# Patient Record
Sex: Female | Born: 1960 | Race: White | Hispanic: No | Marital: Married
Health system: Northeastern US, Academic
[De-identification: ages and names within clinical notes are randomized; demographics above are authoritative.]

---

## 2009-12-04 DEATH — deceased

## 2014-10-02 ENCOUNTER — Telehealth: Payer: Self-pay

## 2014-10-02 NOTE — Telephone Encounter (Signed)
Potential donor for brother, Linward Foster, called 10/02/14 at 1:15 pm.  Please return call to [phone].

## 2014-10-05 ENCOUNTER — Telehealth: Payer: Self-pay

## 2014-10-05 NOTE — Telephone Encounter (Signed)
Contacted Jean Douglas who is interested in being a donor for her brother, Jean Douglas.  Jean Douglas has HTN and has been taking Lisinopril for 1.5 years.  Let donor know that, unfortunately, this excludes her from being a donor at our center.  Donor verbalized understanding.

## 2019-06-06 ENCOUNTER — Encounter: Admit: 2019-06-06 | Payer: PRIVATE HEALTH INSURANCE | Attending: Neurology | Primary: Internal Medicine

## 2019-06-06 DIAGNOSIS — G35 Multiple sclerosis: Secondary | ICD-10-CM

## 2019-06-11 MED ORDER — FINGOLIMOD 0.5 MG CAPSULE
0.5 mg | ORAL_CAPSULE | ORAL | 6 refills | 60.00 days | Status: AC
Start: 2019-06-11 — End: 2019-12-10
  Filled 2019-06-23: qty 15, 30d supply, fill #0

## 2019-06-13 ENCOUNTER — Encounter: Admit: 2019-06-13 | Payer: PRIVATE HEALTH INSURANCE | Attending: Neurology | Primary: Internal Medicine

## 2019-06-13 ENCOUNTER — Ambulatory Visit: Admit: 2019-06-13 | Payer: PRIVATE HEALTH INSURANCE | Attending: Neurology | Primary: Internal Medicine

## 2019-06-13 DIAGNOSIS — I10 Essential (primary) hypertension: Secondary | ICD-10-CM

## 2019-06-13 DIAGNOSIS — G35 Multiple sclerosis: Secondary | ICD-10-CM

## 2019-06-13 MED ORDER — ROSUVASTATIN 5 MG TABLET
5 mg | Status: AC
Start: 2019-06-13 — End: ?

## 2019-06-13 NOTE — Progress Notes
The following is the transcribed Review of Systems completed by the patient at intake.Review of Systems Musculoskeletal: Positive for back pain and neck pain. Neurological: Negative.       NO NEW SYMPTOMS

## 2019-06-13 NOTE — Progress Notes
Last seen 10/9/20MS diagnosed 2004. Last exacerbation 2010. On Avonex 11 years. Gilenya started 12/15. No issues with Gilenya. Gets depressed if she goes off the Sertraline 50 mg. Rare painful dysesthesia right foot and and hand. Except for heat sensitivity she is not very limited by her disease. MRI 10/24/16?of brain showed stable mild T2 disease. C-spine showed normal cord 8/17. MRI 12/28/18 Brain shows no change in MS lesionsMRI 12/28/18 C-spine shows stable since cord lesion These studies are reviewed with the patient.She is JCV Ab +. No new issues since last seen 6 months ago. 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 Has low lymphocytes inspite of taking Gilenya only TIW.She has no new neurological issues. Still some stabbing toe pain at time. ?She remains very active. ?She plays single tennis! ?She is still working and doing very well with real estate.Current Outpatient Medications on File Prior to Visit Medication Sig Dispense Refill ? ergocalciferol (ERGOCALCIFEROL) 1,250 mcg (50,000 unit) capsule Take 5,000 Units by mouth daily.   ? fingolimod (GILENYA) 0.5 mg capsule Take 1 capsule (0.5 mg total) by mouth every other day. 30 capsule 5 ? hydroCHLOROthiazide (HYDRODIURIL) 12.5 mg tablet    ? lisinopril (PRINIVIL,ZESTRIL) 2.5 MG tablet Take 2.5 mg by mouth daily.   ? multivit with minerals/lutein (MULTIVITAMIN 50 PLUS ORAL) multivitamin   ? ALPRAZolam (XANAX) 0.25 mg tablet    ? estradioL (ESTRACE) 0.01 % vaginal cream 1 g.   ? Non-Formulary Medication Avonex  q week   ? rosuvastatin (CRESTOR) 5 mg tablet rosuvastatin 5 mg tablet TAKE ONE TABLET BY MOUTH EVERY DAY   No current facility-administered medications on file prior to visit.  General ExaminationHEENT:  normalNeck:  Supple, non-tender, no carotid bruitsHeart:  Normal rate and rhythm, no murmurs            Good pedal pulses, no edemaChest:Back:  Extremities:  Full ROM, no deformitiesNeurological ExaminationMental Status:   Alert, cooperative, oriented to name, place, time, situation                          Intact recent and remote memory                          Normal mood and affect                          Normal language and thoughtCraneal Nerves:	II  Visual fields intact to confrontation, normal fundoscopy, V/A          Color perception	III, IV, VI   EOMI, No nystagmus, PERRL, 	V  symmetrical facial sensation	VII  symmetrical facial expression	VIII  intact hearing and balance	IX, X  good gag, normal swallow, no dysarthria	XI  Intact shoulder shrug and head rotation	XII   Tongue to midlineMotor:  normal bulk, tone, strength, no pronator driftSensory:  Intact to LT,  Vibration, Romberg negativeCoordination:  Normal RAM, FFM, FtoN, gait, tandemDTRs:  2+ and symmetrical at biceps, triceps, brachioradialis,  Patella and Achilles25 foot walk 3.45 sec  4/9/21Impression:This is 59 year female with 11 year h/o MS.  She is doing extremely well.  She needs her labs check.  Continue Gilenya 3 times per week and Vitamin D

## 2019-06-19 ENCOUNTER — Encounter: Admit: 2019-06-19 | Payer: PRIVATE HEALTH INSURANCE | Attending: Neurology | Primary: Internal Medicine

## 2019-06-19 LAB — CBC AND DIFFERENTIAL
BASOPHILS ABSOLUTE COUNT: 19 {cells}/uL (ref 0–200)
BASOPHILS: 0.6 %
EOSINOPHILS ABSOLUTE COUNT: 51 {cells}/uL (ref 15–500)
EOSINOPHILS: 1.6 %
HEMATOCRIT BLOOD: 41.2 % (ref 35.0–45.0)
HEMOGLOBIN: 13.6 g/dL (ref 11.7–15.5)
LYMPHOCYTES ABSOLUTE COUNT: 355 {cells}/uL — ABNORMAL LOW (ref 850–3900)
LYMPHOCYTES: 11.1 %
MCH: 26.7 pg — ABNORMAL LOW (ref 27.0–33.0)
MCHC-HEMOGLOBINOPATHY: 33 g/dL (ref 32.0–36.0)
MCV: 80.8 fL (ref 80.0–100.0)
MONOCYTES ABSOLUTE COUNT: 426 {cells}/uL (ref 200–950)
MONOCYTES: 13.3 %
MPV: 12.1 fL (ref 7.5–12.5)
NEUTROPHILS ABSOLUTE COUNT: 2349 {cells}/uL (ref 1500–7800)
NEUTROPHILS: 73.4 %
PLATELET COUNT: 206 10*3/uL (ref 140–400)
RDW: 13.6 % (ref 11.0–15.0)
RED BLOOD CELL COUNT: 5.1 10*6/uL (ref 3.80–5.10)
WHITE BLOOD CELL COUNT: 3.2 10*3/uL — ABNORMAL LOW (ref 3.8–10.8)

## 2019-06-19 LAB — COMPREHENSIVE METABOLIC PANEL
ALBUMIN/GLOBULIN RATIO: 1.8 (calc) (ref 1.0–2.5)
ALBUMIN: 4.7 g/dL (ref 3.6–5.1)
ALKALINE PHOSPHATASE: 40 U/L (ref 37–153)
ALT (SGPT): 39 U/L — ABNORMAL HIGH (ref 6–29)
AST (SGOT): 28 U/L (ref 10–35)
BILIRUBIN, TOTAL: 0.6 mg/dL (ref 0.2–1.2)
BLOOD UREA NITROGEN: 16 mg/dL (ref 7–25)
CALCIUM: 10 mg/dL (ref 8.6–10.4)
CHLORIDE: 100 mmol/L (ref 98–110)
CO2: 28 mmol/L (ref 20–32)
CREATININE: 0.83 mg/dL (ref 0.50–0.99)
EGFR (AFR AMER): 89 mL/min/{1.73_m2} (ref >=60)
EGFR (NON AFRICAN AMERICAN): 77 mL/min/{1.73_m2} (ref >=60)
GLOBULIN: 2.6 g/dL (ref 1.9–3.7)
GLUCOSE: 86 mg/dL (ref 65–99)
POTASSIUM: 4 mmol/L (ref 3.5–5.3)
PROTEIN, TOTAL, SPEP: 7.3 g/dL (ref 6.1–8.1)
SODIUM: 137 mmol/L (ref 135–146)

## 2019-07-25 MED FILL — FINGOLIMOD 0.5 MG CAPSULE: 0.5 mg | ORAL | 30 days supply | Qty: 15 | Fill #1 | Status: CP

## 2019-08-25 MED FILL — FINGOLIMOD 0.5 MG CAPSULE: 0.5 mg | ORAL | 30 days supply | Qty: 15 | Fill #2 | Status: CP

## 2019-10-07 ENCOUNTER — Encounter: Admit: 2019-10-07 | Payer: PRIVATE HEALTH INSURANCE | Primary: Internal Medicine

## 2019-10-12 ENCOUNTER — Telehealth: Admit: 2019-10-12 | Payer: PRIVATE HEALTH INSURANCE | Attending: Neurology | Primary: Internal Medicine

## 2019-10-12 NOTE — Telephone Encounter
Called 1x, spoke w/ patient who will call back on Monday to r/s

## 2019-10-13 MED FILL — FINGOLIMOD 0.5 MG CAPSULE: 0.5 mg | ORAL | 30 days supply | Qty: 15 | Fill #3 | Status: CP

## 2019-10-29 ENCOUNTER — Telehealth: Admit: 2019-10-29 | Payer: PRIVATE HEALTH INSURANCE | Attending: Neurology | Primary: Internal Medicine

## 2019-10-29 ENCOUNTER — Encounter: Admit: 2019-10-29 | Payer: PRIVATE HEALTH INSURANCE | Attending: Neurology | Primary: Internal Medicine

## 2019-10-29 NOTE — Telephone Encounter
YM CARE CENTER MESSAGETime of call:   2:49 PMCaller:   Larin Doctor, hospital relationship to patient:  IT sales professional from (pharmacy, hospital, agency, etc.):  Home    Reason for call:   Jennifer Clark would like to know if it is okay for her to get her Covid booster shot. She needs to know soon so that she can go to [facility] today.If not feeling well, what are symptoms:  n/a   If having symptoms, how long have the symptoms been present:  n/a   Does caller request to speak to someone urgently?  yes   Best telephone number for callback:   [phone] time to return call:   Anytime Permission to leave message:  yes   Larose Kells [facility] Scheduler

## 2019-11-28 ENCOUNTER — Encounter: Admit: 2019-11-28 | Payer: PRIVATE HEALTH INSURANCE | Attending: Neurology | Primary: Internal Medicine

## 2019-12-03 ENCOUNTER — Encounter: Admit: 2019-12-03 | Payer: PRIVATE HEALTH INSURANCE | Attending: Neurology | Primary: Internal Medicine

## 2019-12-05 ENCOUNTER — Encounter: Admit: 2019-12-05 | Payer: PRIVATE HEALTH INSURANCE | Attending: Neurology | Primary: Internal Medicine

## 2019-12-05 DIAGNOSIS — G35 Multiple sclerosis: Secondary | ICD-10-CM

## 2019-12-10 MED ORDER — FINGOLIMOD 0.5 MG CAPSULE
0.5 mg | ORAL_CAPSULE | ORAL | 4 refills | 30.00 days | Status: AC
Start: 2019-12-10 — End: 2019-12-19

## 2019-12-10 NOTE — Telephone Encounter
Medication Refill Safety Checks:Medication order: gilenya Is this a new order? NoIf yes, was the previous order discontinued? Not ApplicableIf the order is being updated, did you update the instruction box? Not ApplicableWere the medication safety checks including the 5 rights of medication administration (Patient, medication name, dose, route, time/frequency) completed prior to sending the request to pharmacy? YesFor tapering/titrating dosages, were the medication dosages and schedules verified with a second nurse? Not ApplicableCorrect pharmacy? yes

## 2019-12-11 ENCOUNTER — Encounter: Admit: 2019-12-11 | Payer: PRIVATE HEALTH INSURANCE | Attending: Neurology | Primary: Internal Medicine

## 2019-12-11 DIAGNOSIS — G35 Multiple sclerosis: Secondary | ICD-10-CM

## 2019-12-17 ENCOUNTER — Encounter: Admit: 2019-12-17 | Payer: PRIVATE HEALTH INSURANCE | Attending: Neurology | Primary: Internal Medicine

## 2019-12-19 ENCOUNTER — Encounter: Admit: 2019-12-19 | Payer: PRIVATE HEALTH INSURANCE | Attending: Neurology | Primary: Internal Medicine

## 2019-12-19 DIAGNOSIS — G35 Multiple sclerosis: Secondary | ICD-10-CM

## 2019-12-19 MED ORDER — FINGOLIMOD 0.5 MG CAPSULE
0.5 mg | ORAL_CAPSULE | ORAL | 4 refills | 30.00 days | Status: AC
Start: 2019-12-19 — End: 2020-04-04
  Filled 2019-12-25: qty 10, 30d supply, fill #0

## 2020-01-21 MED FILL — FINGOLIMOD 0.5 MG CAPSULE: 0.5 mg | ORAL | 30 days supply | Qty: 10 | Fill #1 | Status: CP

## 2020-02-04 ENCOUNTER — Encounter: Admit: 2020-02-04 | Payer: PRIVATE HEALTH INSURANCE | Attending: Neurology | Primary: Internal Medicine

## 2020-02-06 ENCOUNTER — Telehealth: Admit: 2020-02-06 | Payer: PRIVATE HEALTH INSURANCE | Attending: Neurology | Primary: Internal Medicine

## 2020-02-06 NOTE — Telephone Encounter
Called patient to schedule for a tele health appointment for pt Advice request inbasket and patient declined requesting a call back from Dr. Michaell Cowing today.Patient stated she is currently sick with covid and has some questions.Patient stated she had a infusion yesterday at [facility] and she is not feeling well and she would like to talk to Dr. Michaell Cowing regarding one of her medications being the reason she got covid.Patient # [phone]

## 2020-02-26 MED FILL — FINGOLIMOD 0.5 MG CAPSULE: 0.5 mg | ORAL | 30 days supply | Qty: 10 | Fill #2 | Status: CP

## 2020-03-29 ENCOUNTER — Encounter: Admit: 2020-03-29 | Payer: PRIVATE HEALTH INSURANCE | Attending: Neurology | Primary: Internal Medicine

## 2020-04-04 ENCOUNTER — Encounter: Admit: 2020-04-04 | Payer: PRIVATE HEALTH INSURANCE | Attending: Neurology | Primary: Internal Medicine

## 2020-04-04 DIAGNOSIS — G35 Multiple sclerosis: Secondary | ICD-10-CM

## 2020-04-04 MED ORDER — FINGOLIMOD 0.5 MG CAPSULE
0.5 mg | ORAL_CAPSULE | ORAL | 4 refills | 30.00 days | Status: AC
Start: 2020-04-04 — End: 2020-04-05

## 2020-04-05 ENCOUNTER — Encounter: Admit: 2020-04-05 | Payer: PRIVATE HEALTH INSURANCE | Attending: Neurology | Primary: Internal Medicine

## 2020-04-05 DIAGNOSIS — G35 Multiple sclerosis: Secondary | ICD-10-CM

## 2020-04-05 MED ORDER — FINGOLIMOD 0.5 MG CAPSULE
0.5 mg | ORAL_CAPSULE | ORAL | 4 refills | 84.00 days | Status: AC
Start: 2020-04-05 — End: 2020-12-04
  Filled 2020-04-12: qty 12, 28d supply, fill #0

## 2020-05-22 MED FILL — FINGOLIMOD 0.5 MG CAPSULE: 0.5 mg | ORAL | 28 days supply | Qty: 12 | Fill #1 | Status: CP

## 2020-06-24 MED FILL — FINGOLIMOD 0.5 MG CAPSULE: 0.5 mg | ORAL | 28 days supply | Qty: 12 | Fill #2 | Status: CP

## 2020-08-11 MED FILL — FINGOLIMOD 0.5 MG CAPSULE: 0.5 mg | ORAL | 28 days supply | Qty: 12 | Fill #3 | Status: CP

## 2020-08-31 ENCOUNTER — Encounter: Admit: 2020-08-31 | Payer: PRIVATE HEALTH INSURANCE | Attending: Neurology | Primary: Internal Medicine

## 2020-08-31 DIAGNOSIS — G35 Multiple sclerosis: Secondary | ICD-10-CM

## 2020-08-31 DIAGNOSIS — I10 Essential (primary) hypertension: Secondary | ICD-10-CM

## 2020-08-31 MED ORDER — ALPRAZOLAM 0.25 MG TABLET
0.25 mg | ORAL_TABLET | Freq: Two times a day (BID) | ORAL | 1 refills | 5.00 days | Status: AC | PRN
Start: 2020-08-31 — End: ?
  Filled 2020-09-01: qty 10, 5d supply, fill #0
  Filled 2020-09-01: qty 10, 5d supply, fill #1

## 2020-08-31 NOTE — Progress Notes
Last seen 4/9/22MS diagnosed 2004. Had left sided numbness. Some weakness.  MRI positive.  CSF positive.  Diagnosed by Dr. Nori Riis in [place]. Followed by Donell Sievert at [facility] for a while. Last exacerbation 2010. On Avonex 11 years. Gilenya started 12/15. No issues with Gilenya. Gets depressed if she goes off the Sertraline 50 mg. Rare painful dysesthesia right foot and and hand. Except for heat sensitivity she is not very limited by her disease. MRI 10/24/16?of brain showed stable mild T2 disease. C-spine showed normal cord 8/17. MRI 12/28/18 Brain shows no change in MS lesionsMRI 12/28/18 C-spine shows stable since cord lesion These studies are reviewed with the patient.She is JCV Ab +. No new issues since last seen 6 months ago. Is taking Gilenya?3 times a week due to persistent lymphpenia.Still plays single tennis frequently. Has low lymphocytes inspite of taking Gilenya only TIW. She has?no?new neurological issues. Still some stabbing toe pain at time. ?She remains very active. ?She plays single tennis!??She is still working and doing very well with real estate.6/28/22MS 18 years.  She remains active and plays tennis. Walks a few miles a day. Still works in Research officer, political party.  Continues to feel well.  06/18/20 lymphocytes 335Still only takes Gilenya .5 mg TIWGets electrical sensation in right toes every few weeks which lasts 3-4 minutes.  It is quite severe.  Gets fatigued in the afternoon.  She has to rest.She had Covid-19 in the spring.  Current Outpatient Medications on File Prior to Visit Medication Sig Dispense Refill ? ergocalciferol (ERGOCALCIFEROL) 1,250 mcg (50,000 unit) capsule Take 5,000 Units by mouth daily.   ? estradioL (ESTRACE) 0.01 % vaginal cream 1 g.   ? fingolimod (GILENYA) 0.5 mg capsule Take 1 capsule (0.5 mg total) by mouth Every Monday, Wednesday, and Friday. 36 capsule 3 ? hydroCHLOROthiazide (HYDRODIURIL) 12.5 mg tablet    ? lisinopril (PRINIVIL,ZESTRIL) 2.5 MG tablet Take 2.5 mg by mouth daily.   ? multivit with minerals/lutein (MULTIVITAMIN 50 PLUS ORAL) multivitamin   ? rosuvastatin (CRESTOR) 5 mg tablet rosuvastatin 5 mg tablet TAKE ONE TABLET BY MOUTH EVERY DAY   ? ALPRAZolam (XANAX) 0.25 mg tablet    ? Non-Formulary Medication Avonex  q week   No current facility-administered medications on file prior to visit. Past Medical History: Diagnosis Date ? Hypertension  ? Multiple sclerosis (HC Code)  General ExaminationHEENT:  normalNeck:  Supple, non-tender, no carotid bruitsHeart:  Normal rate and rhythm, no murmurs            Good pedal pulses, no edemaChest:Back:  Extremities:  Full ROM, no deformitiesNeurological ExaminationMental Status:   Alert, cooperative, oriented to name, place, time, situation                          Intact recent and remote memory                          Normal mood and affect                          Normal language and thoughtCraneal Nerves:	II  Visual fields intact to confrontation, normal fundoscopy, V/A          Color perception	III, IV, VI   EOMI, No nystagmus, PERRL, 	V  symmetrical facial sensation	VII  symmetrical facial expression	VIII  intact hearing and balance	IX, X  good gag, normal swallow, no dysarthria	XI  Intact shoulder shrug and head  rotation	XII   Tongue to midlineMotor:  normal bulk, tone, strength, no pronator driftSensory:  Intact to LT,  Vibration, Romberg negativeCoordination:  Normal RAM, FFM, FtoN, gait, tandemDTRs:  2+ and symmetrical at biceps, triceps, brachioradialis,  Patella and AchillesPlantar response:  FlexorImpression:  Jennifer Clark is 60 year old female with 42 year h/o MS.  She has been very stable on Gilenya for 7 years.  She has persistent lymphopenia and only takes Gilenya 3 times a week.  She can continue on Gilenya.Plan:1.  Continue Gilenya .5 mg  3 times per week2.  Labs with lymphocyte subsets3.  Covid-19 spike antibodies4.  MRIs

## 2020-09-01 MED FILL — FINGOLIMOD 0.5 MG CAPSULE: 0.5 mg | ORAL | 28 days supply | Qty: 12 | Fill #4 | Status: CP

## 2020-09-03 LAB — SARS-COV-2 (COVID-19) SPIKE ANTIBODY (IGG), SEMI-QUANTITATIVE (GH L Q)
IMMUNOGLOBULIN G SUBCLASS 2: 102.15 index — ABNORMAL HIGH (ref <1.00)
SARS-COV-2 SPIKE (IGG), SEMI-QUANTITATIVE: 102.15 {index} — ABNORMAL HIGH (ref <1.00)

## 2020-09-03 LAB — LYMPHOCYTE SUBSET PANEL 2     (LMW Q)
% CD19 (B CELLS): 9 % (ref 6–29)
% CD3 (MATURE T CELLS): 25 % — ABNORMAL LOW (ref 57–85)
ABSOLUTE CD19+ CELLS: 21 {cells}/uL — ABNORMAL LOW (ref 110–660)
CD3 ABSOLUTE: 62 {cells}/uL — ABNORMAL LOW (ref 840–3060)
CD4 % HELPER T CELL: 9 % — ABNORMAL LOW (ref 30–61)
CD4 T CELL ABSOLUTE: 21 {cells}/uL — ABNORMAL LOW (ref 490–1740)
CD8 % SUPPRESSOR T CELL: 17 % (ref 12–42)
CD8 T CELL ABSOLUTE: 42 cells/uL — ABNORMAL LOW (ref 180–1170)
HELPER/SUPPRESSOR RATIO: 0.51 — ABNORMAL LOW (ref 0.86–5.00)
LYMPHOCYTES ABSOLUTE COUNT: 245 cells/uL — ABNORMAL LOW (ref 850–3900)

## 2020-09-03 LAB — IMMUNOGLOBULIN G SUBCLASSES     (BH GH LMW Q YH)
IMMUNOGLOBULIN G SUBCLASS 1: 653 mg/dL (ref 382–929)
IMMUNOGLOBULIN G SUBCLASS 3: 15 mg/dL — ABNORMAL LOW (ref 22–178)
IMMUNOGLOBULIN G SUBCLASS 4: 23.8 mg/dL (ref 4–86)
IMMUNOGLOBULIN G, SERUM: 976 mg/dL (ref 600–1540)

## 2020-09-04 ENCOUNTER — Encounter: Admit: 2020-09-04 | Payer: PRIVATE HEALTH INSURANCE | Attending: Neurology | Primary: Internal Medicine

## 2020-09-20 ENCOUNTER — Encounter: Admit: 2020-09-20 | Payer: PRIVATE HEALTH INSURANCE | Attending: Neurology | Primary: Internal Medicine

## 2020-09-20 ENCOUNTER — Telehealth: Admit: 2020-09-20 | Payer: PRIVATE HEALTH INSURANCE | Attending: Neurology | Primary: Internal Medicine

## 2020-09-20 NOTE — Telephone Encounter
Faxed and Confirmation received

## 2020-09-20 NOTE — Telephone Encounter
YM CARE CENTER MESSAGETime of call:   1:35 PMCaller:   PhilCaller's relationship to patient:  n/a  Calling from (pharmacy, hospital, agency, etc.):  [facility]   Reason for call:   Phil from [facility] called, he says patient called to set up appointment for MRI of brain but he does not have order on file for that. Please fax order to [phone] not feeling well, what are symptoms:  n/a   If having symptoms, how long have the symptoms been present:  n/a   Does caller request to speak to someone urgently?  no   If yes, warm transferred to:  Best telephone number for callback:  [phone] time to return call:   Anytime before 6:00 pm Permission to leave message:  yes   [facility] Referral Specialist

## 2020-10-05 ENCOUNTER — Encounter: Admit: 2020-10-05 | Payer: PRIVATE HEALTH INSURANCE | Attending: Neurology | Primary: Internal Medicine

## 2020-10-05 DIAGNOSIS — G35 Multiple sclerosis: Secondary | ICD-10-CM

## 2020-10-05 MED FILL — FINGOLIMOD 0.5 MG CAPSULE: 0.5 mg | ORAL | 28 days supply | Qty: 12 | Fill #5 | Status: CP

## 2020-10-21 ENCOUNTER — Encounter: Admit: 2020-10-21 | Payer: PRIVATE HEALTH INSURANCE | Attending: Neurology | Primary: Internal Medicine

## 2020-11-10 MED FILL — FINGOLIMOD 0.5 MG CAPSULE: 0.5 mg | ORAL | 28 days supply | Qty: 12 | Fill #6 | Status: CP

## 2020-12-04 ENCOUNTER — Encounter: Admit: 2020-12-04 | Payer: PRIVATE HEALTH INSURANCE | Primary: Internal Medicine

## 2020-12-04 MED ORDER — FINGOLIMOD 0.5 MG CAPSULE
0.5 mg | ORAL_CAPSULE | ORAL | 1 refills | 84.00 days | Status: AC
Start: 2020-12-04 — End: 2021-04-02
  Filled 2020-12-13: qty 12, 28d supply, fill #0

## 2020-12-04 MED ORDER — FINGOLIMOD 0.5 MG CAPSULE
0.5 mg | ORAL_CAPSULE | ORAL | 1 refills | 84.00 days | Status: AC
Start: 2020-12-04 — End: 2020-12-04

## 2021-01-15 MED FILL — FINGOLIMOD 0.5 MG CAPSULE: 0.5 mg | ORAL | 28 days supply | Qty: 12 | Fill #1 | Status: CP

## 2021-03-11 MED FILL — FINGOLIMOD 0.5 MG CAPSULE: 0.5 mg | ORAL | 28 days supply | Qty: 12 | Fill #2 | Status: CP

## 2021-04-01 ENCOUNTER — Encounter: Admit: 2021-04-01 | Payer: PRIVATE HEALTH INSURANCE | Attending: Neurology | Primary: Internal Medicine

## 2021-04-02 MED ORDER — FINGOLIMOD 0.5 MG CAPSULE
0.5 mg | ORAL_CAPSULE | ORAL | 1 refills | 28.00 days | Status: AC
Start: 2021-04-02 — End: 2021-04-21
  Filled 2021-04-13: qty 36, 28d supply, fill #1
  Filled 2021-04-13: qty 12, 28d supply, fill #0

## 2021-04-21 ENCOUNTER — Ambulatory Visit
Admit: 2021-04-21 | Payer: PRIVATE HEALTH INSURANCE | Attending: Student in an Organized Health Care Education/Training Program | Primary: Internal Medicine

## 2021-04-21 ENCOUNTER — Encounter
Admit: 2021-04-21 | Payer: PRIVATE HEALTH INSURANCE | Attending: Student in an Organized Health Care Education/Training Program | Primary: Internal Medicine

## 2021-04-21 DIAGNOSIS — I10 Essential (primary) hypertension: Secondary | ICD-10-CM

## 2021-04-21 DIAGNOSIS — G35 Multiple sclerosis: Secondary | ICD-10-CM

## 2021-04-21 MED ORDER — FINGOLIMOD 0.5 MG CAPSULE
0.5 mg | ORAL_CAPSULE | Freq: Every day | ORAL | 3 refills | 30.00 days | Status: AC
Start: 2021-04-21 — End: 2021-08-02
  Filled 2021-04-26: qty 30, 30d supply, fill #0

## 2021-04-21 NOTE — Progress Notes
NEUROIMMUNOLOGY NEW PATIENT VISITHPI: I had the pleasure of evaluating Jennifer Clark for an initial outpatient neurologic consultation at the [facility] today, 04/21/2021. Multiple Sclerosis History: MS was diagnosed 2004, at which time she developed left sided paresthesias suddenly while in the canoe. This was face/arm/leg on that side. Slight L arm weakness with this. Went to an MD who was worried about a stroke and sent her to [facility].  MRI positive and CSF were reportedly consistent with MS.  Diagnosed by Dr. Nori Riis in [place]. Followed by Donell Sievert at [facility] for a while. Last exacerbation 2010. She was on Avonex for 11 years, had flu-like side effects. Gilenya was then started 02/2014. No issues with Gilenya.  Rare painful dysesthesia right foot and and hand. Except for heat sensitivity she is not very limited by her disease. In 2020 she started taking Gilenya?3 times a week due to persistent lymphopenia.?She plays single tennis, still working and doing very well with real estate.She had COVID 01/2020 and again 03/2021. First time she had to get monoclonal Ab, but was out for about 8 weeks. She had been vaccinated. She continues on TIW dosing. No progression. Biggest symptom is fatigue, mainly in the late afternoon. She also notes pain in her R front toe- intermittent and unclear trigger. Lasts a few minutes. Yoga/medication. She walks 4 miles and swims regularly. Went to [employer] and came in second place!?Review of Systems: A complete 14-point review of systems was completed by the patient and entered separately. Pertinent positives were reviewed by me. Allergies:Jennifer Clark is allergic to prednisone.PMH: Jennifer Clark  has a past medical history of Hypertension and Multiple sclerosis (HC Code) (HC CODE) (HC Code).Jennifer Clark  has a past surgical history that includes Appendectomy and Ectopic pregnancy surgery.Social history:Children: YesOccupation: real estateShe  reports that she has never smoked. She does not have any smokeless tobacco history on file. She reports that she does not drink alcohol and does not use drugs.Family History:  No h/o autoimmune diseases including MS. Jennifer Clark Family history is unknown by patient.  Current medications: Current Outpatient Medications: ?  ALPRAZolam, 0.25 mg, Oral, BID PRN?  ergocalciferol, 5,000 Units, Oral, Daily?  hydroCHLOROthiazide, ?  lisinopriL, 2.5 mg, Oral, Daily?  multivit with minerals/lutein (MULTIVITAMIN 50 PLUS ORAL), multivitamin?  rosuvastatin, rosuvastatin 5 mg tablet  TAKE ONE TABLET BY MOUTH EVERY DAY?  fingolimod, 0.5 mg, Oral, DailyPhysical examinationVitals:  04/21/21 1353 BP: 136/87 Pulse: 71  Weight: 79.4 kg General: Jennifer Clark is a  well-developed White female. She is pleasant and cooperative with the exam. No rashes or pedal edema.Neurologic Exam Mental Status Oriented to person, place, and time. Follows 3 step commands. Attention: normal. Concentration: normal. Speech: speech is normal Level of consciousness: alertKnowledge: good. Able to name object. Able to read. Normal comprehension. Cranial Nerves CN II Visual fields full to confrontation. Right visual field deficit: noneLeft visual field deficit: none CN III, IV, VI Pupils are equal, round, and reactive to light.Extraocular motions are normal. Right pupil: Reactivity: brisk. Consensual response: intact. Left pupil: Reactivity: brisk. Consensual response: intact. CN III: no CN III palsyCN VI: no CN VI palsyNystagmus: none Diplopia: noneOphthalmoparesis: noneUpgaze: normalDowngaze: normalConjugate gaze: presentCN V Facial sensation intact. CN VII Facial expression full, symmetric. CN VIII CN VIII normal. CN IX, X CN IX normal. CN X normal. CN XI CN XI normal. Right sternocleidomastoid strength: normalLeft sternocleidomastoid strength: normalRight trapezius strength: normalLeft trapezius strength: normalCN XII CN XII normal. Motor Exam Muscle bulk:  normalOverall muscle tone: normalRight arm tone: normalLeft arm tone: normalRight arm pronator drift: absentLeft arm pronator drift: absentRight leg tone: normalLeft leg tone: normalStrength Strength 5/5 except as noted. Sensory Exam Light touch normal. Right leg vibration: decreased from toesLeft leg vibration: decreased from toesPinprick normal. Gait, Coordination, and Reflexes GaitGait: normalCoordination Romberg: positiveFinger to nose coordination: normalHeel to shin coordination: normalTandem walking coordination: normalTremor Resting tremor: absentIntention tremor: absentAction tremor: absentReflexes Reflexes 2+ except as noted. Imaging:MRI brain 10/2020:  The actual images were personally reviewed and interpreted by me. Brain Parenchyma:  There are scattered foci of white matter signal hyperintensity on FLAIR sequences involving both cerebral hemispheres which could be attributed to given history of multiple sclerosis and similar to the prior. There are no new lesions identified.  There is no evidence of acute infarction.  There is no evidence of parenchymal hemorrhage.MRI C spine 10/2020: The actual images were personally reviewed and interpreted by me.   On the prior, there was a small elongated focus of T2 signal abnormality involving the dorsal lateral aspect of the cord on the LEFT extending from mid C7 to mid T1. This is again seen on the axial T2-weighted images without change. No new cord lesions identified.Data: Component    Latest Ref Rng 08/31/2020 WBC    3.8 - 10.8 Thousand/uL 3.5 (L)  RBC    3.80 - 5.10 Million/uL 5.12 (H)  Hemoglobin    11.7 - 15.5 g/dL 09.8  Hematocrit    11.9 - 45.0 % 41.6  MCV    80.0 - 100.0 fL 81.3  MCH    27.0 - 33.0 pg 27.1  MCHC    32.0 - 36.0 g/dL 14.7  RDW-CV    82.9 - 15.0 % 13.7  Platelets    140 - 400 Thousand/uL 221  MPV    7.5 - 12.5 fL 11.6  Neutrophils Absolute    1,500 - 7,800 cells/uL 2,860  Lymphocytes Absolute    850 - 3,900 cells/uL 273 (L)  Monocytes (Absolute)    200 - 950 cells/uL 315  Eosinophils Absolute    15 - 500 cells/uL 32  Basophils Absolute    0 - 200 cells/uL 21  Neutrophils    % 81.7  Lymphocytes    % 7.8  Monocytes    % 9.0  Eosinophils    % 0.9  Basophils    % 0.6  Glucose    65 - 99 mg/dL 84  BUN    7 - 25 mg/dL 15  Creatinine    5.62 - 0.99 mg/dL 1.30  eGFR (NON African-American)    > OR = 60 mL/min/1.39m2 90  eGFR (Afr Amer)    > OR = 60 mL/min/1.5m2 105  BUN/Creatinine Ratio    6 - 22 (calc) NOT APPLICABLE  Sodium    135 - 146 mmol/L 139  Potassium    3.5 - 5.3 mmol/L 4.0  Chloride    98 - 110 mmol/L 102  CO2    20 - 32 mmol/L 27  Calcium    8.6 - 10.4 mg/dL 9.8  Protein, Total    6.1 - 8.1 g/dL 7.1  Albumin    3.6 - 5.1 g/dL 4.6  Globulin    1.9 - 3.7 g/dL (calc) 2.5  Albumin/Globulin Ratio    1.0 - 2.5 (calc) 1.8  Bilirubin, Total    0.2 - 1.2 mg/dL 0.7  Alkaline Phosphatase    37 - 153 U/L 48  AST    10 - 35  U/L 27  ALT (SGPT)    6 - 29 U/L 41 (H)  % CD3 (Mature T Cells)    57 - 85 % 25 (L)  CD3 Abs    840 - 3,060 cells/uL 62 (L)  CD4 % Helper T Cell    30 - 61 % 9 (L)  CD4 T Cell Abs    490 - 1,740 cells/uL 21 (L)  CD8 % Suppressor T Cell    12 - 42 % 17  CD8 T Cell Abs    180 - 1,170 cells/uL 42 (L)  Helper/Suppressor Ratio    0.86 - 5.00  0.51 (L)  % CD19 (B CELLS)    6 - 29 % 9  ABSOLUTE CD19+ CELLS    110 - 660 cells/uL 21 (L)  Lymphocytes Absolute Count    850 - 3,900 cells/uL 245 (L)  IgG Subclass 1     382 - 929 mg/dL 403  IgG Subclass 2    241 - 700 mg/dL 474 (L)  IgG Subclass 3    22 - 178 mg/dL 15 (L)  IgG Subclass 4    4 - 86 mg/dL 25.9  Immunoglobulin G, Serum    600 - 1,540 mg/dL 563  SARS CoV2, IgG, Spike, Semi-Quant    <1.00 index 102.15 (H)   (L) Low(H) HighAssessment:Jennifer Clark is a 61 y.o. 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 She appears clinically and radiologically stable on her current regimen with likely adequate lymphocyte sequestration based on peripheral counts. We did discuss that data regarding efficacy at alternate dosing is limited, but in retrospective studies patients who broke through were more inflammatory even on normal dosing. She has not had any increased rate of infections, but some increased severity with infection (COVID In particular). Will plan to continue on current course for now, but consider alternate DMT pending labs and clinical course. Otherwise, I encouraged continuation of regular exercise and targeting ideal BMI. Remainder as below.Plan:-Labs now at [facility] brain next 10/2021; alternate brain and brain/spinal cord QOY-Refill Gilenya today, continue at TIW for now-Weight loss apps, continue with regular exercise-Continue to discuss DMT switch pending repeat labs and clinical course-Follow up in 6 monthsSamantha Jah Alarid, MDAssistant Professor of NeurologyYale UAL Corporation of Medicine 60 minutes was spent with this patient, >50% was spent discussing the considerations for diagnosis and recommended testing, as well as answering the patient's questions.  I have asked that the patient call our office with any questions, concerns, or if they develop new symptoms.

## 2021-05-01 ENCOUNTER — Encounter
Admit: 2021-05-01 | Payer: PRIVATE HEALTH INSURANCE | Attending: Student in an Organized Health Care Education/Training Program | Primary: Internal Medicine

## 2021-05-01 LAB — LYMPHOCYTE SUBSET PANEL 2     (LMW Q)
% CD19 (B CELLS): 8 % (ref 6–29)
% CD3 (MATURE T CELLS): 21 % — ABNORMAL LOW (ref 57–85)
ABSOLUTE CD19+ CELLS: 31 {cells}/uL — ABNORMAL LOW (ref 110–660)
BKR WAM EOSINOPHIL ABSOLUTE COUNT.: 31 cells/uL — ABNORMAL LOW (ref 110–660)
CD3 ABSOLUTE: 77 {cells}/uL — ABNORMAL LOW (ref 840–3060)
CD4 % HELPER T CELL: 6 % — ABNORMAL LOW (ref 30–61)
CD4 T CELL ABSOLUTE: 23 cells/uL — ABNORMAL LOW (ref 490–1740)
CD8 % SUPPRESSOR T CELL: 14 % (ref 12–42)
CD8 T CELL ABSOLUTE: 53 cells/uL — ABNORMAL LOW (ref 180–1170)
HELPER/SUPPRESSOR RATIO: 0.44 — ABNORMAL LOW (ref 0.86–5.00)
LYMPHOCYTES ABSOLUTE COUNT: 371 cells/uL — ABNORMAL LOW (ref 850–3900)

## 2021-05-01 LAB — IMMUNOGLOBULINS IGG, IGA, IGM
BKR WAM MONOCYTES: 1009 mg/dL — ABNORMAL LOW (ref 600–1540)
IMMUNOGLOBULIN G: 1009 mg/dL (ref 600–1540)
IMMUNOGLOBULIN M: 55 mg/dL (ref 50–300)

## 2021-05-23 MED FILL — FINGOLIMOD 0.5 MG CAPSULE: 0.5 mg | ORAL | 30 days supply | Qty: 30 | Fill #1 | Status: CP

## 2021-06-23 ENCOUNTER — Encounter
Admit: 2021-06-23 | Payer: PRIVATE HEALTH INSURANCE | Attending: Student in an Organized Health Care Education/Training Program | Primary: Internal Medicine

## 2021-07-02 ENCOUNTER — Encounter: Admit: 2021-07-02 | Payer: PRIVATE HEALTH INSURANCE | Primary: Internal Medicine

## 2021-07-07 MED FILL — FINGOLIMOD 0.5 MG CAPSULE: 0.5 mg | ORAL | 30 days supply | Qty: 30 | Fill #3 | Status: CP

## 2021-07-07 MED FILL — FINGOLIMOD 0.5 MG CAPSULE: 0.5 mg | ORAL | 30 days supply | Qty: 30 | Fill #2 | Status: CP

## 2021-07-29 ENCOUNTER — Encounter
Admit: 2021-07-29 | Payer: PRIVATE HEALTH INSURANCE | Attending: Student in an Organized Health Care Education/Training Program | Primary: Internal Medicine

## 2021-07-29 DIAGNOSIS — G35 Multiple sclerosis: Secondary | ICD-10-CM

## 2021-08-02 MED ORDER — FINGOLIMOD 0.5 MG CAPSULE
0.5 mg | ORAL_CAPSULE | Freq: Every day | ORAL | 6 refills | 30.00 days | Status: AC
Start: 2021-08-02 — End: 2021-08-05

## 2021-08-05 ENCOUNTER — Encounter
Admit: 2021-08-05 | Payer: PRIVATE HEALTH INSURANCE | Attending: Student in an Organized Health Care Education/Training Program | Primary: Internal Medicine

## 2021-08-05 DIAGNOSIS — G35 Multiple sclerosis: Secondary | ICD-10-CM

## 2021-08-05 MED ORDER — FINGOLIMOD 0.5 MG CAPSULE
0.5 mg | ORAL_CAPSULE | ORAL | 6 refills | 70.00 days | Status: AC
Start: 2021-08-05 — End: 2022-01-11
  Filled 2021-09-05: qty 12, 28d supply, fill #0

## 2021-08-30 ENCOUNTER — Encounter
Admit: 2021-08-30 | Payer: PRIVATE HEALTH INSURANCE | Attending: Student in an Organized Health Care Education/Training Program | Primary: Internal Medicine

## 2021-09-29 MED FILL — FINGOLIMOD 0.5 MG CAPSULE: 0.5 mg | ORAL | 28 days supply | Qty: 12 | Fill #1 | Status: CP

## 2021-10-12 ENCOUNTER — Telehealth
Admit: 2021-10-12 | Payer: PRIVATE HEALTH INSURANCE | Attending: Student in an Organized Health Care Education/Training Program | Primary: Internal Medicine

## 2021-10-12 NOTE — Telephone Encounter
YM CARE CENTER MESSAGETime of call:   11:45 AMCaller:   DianeCaller's relationship to patient:  Ambulance person from (pharmacy, hospital, agency, etc.):  n/a   Reason for call:   Patient is having knee surgery on 10/24/21, states she needs a medical clearance letter from Dr.Epstein. Dr.StricklandTel-[phone] not feeling well, what are symptoms:  n/a   If having symptoms, how long have the symptoms been present:  n/a   Does caller request to speak to someone urgently?  no   If yes, warm transferred to:  n/aProvider:  Dr. Gwyneth Sprout Last clinic visit date: 2/16/23Best telephone number for callback:   [phone] Best time to return call (encourage patient to be available for callback):   anytime Permission to leave message:  yes   [facility]

## 2021-10-13 NOTE — Telephone Encounter
Pt is calling back for a letter of clearance for knee surgery which is scheduled for 8/21.She says that no one has called her back and she's left a couple messages.The letter is needed 1 week before surgery no later than 8/14th. Pt is very upset.Call back#: [phone]

## 2021-10-13 NOTE — Telephone Encounter
YM CARE CENTER MESSAGETime of call:   9:17 AMCaller:   Jennifer MillasCaller's relationship to patient:  patient  Calling from [employer], hospital, agency, etc.):  n/a   Reason for call:   Received call from Jennifer Clark stating she was awaiting a call back from Dr.Epstein office regarding medical clearance for knee surgery on 10/24/21.[name] requesting a call back as soon as possibleIf not feeling well, what are symptoms:  n/a   If having symptoms, how long have the symptoms been present:  n/a   Does caller request to speak to someone urgently?  no   If yes, warm transferred to:  n/aProvider:  Dr. Marcy Panning clinic visit date: 2/16/23Best telephone number for callback:   [phone] Best time to return call (encourage patient to be available for callback):   anytime Permission to leave message:  yes   [facility]

## 2021-10-14 ENCOUNTER — Encounter
Admit: 2021-10-14 | Payer: PRIVATE HEALTH INSURANCE | Attending: Student in an Organized Health Care Education/Training Program | Primary: Internal Medicine

## 2021-10-14 NOTE — Telephone Encounter
Letter faxed to Dr. Dorothey Baseman with confirmation. Patient made aware via Mychart.

## 2021-11-09 MED FILL — FINGOLIMOD 0.5 MG CAPSULE: 0.5 mg | ORAL | 28 days supply | Qty: 12 | Fill #2 | Status: CP

## 2021-11-14 ENCOUNTER — Encounter
Admit: 2021-11-14 | Payer: PRIVATE HEALTH INSURANCE | Attending: Student in an Organized Health Care Education/Training Program | Primary: Internal Medicine

## 2021-11-29 ENCOUNTER — Encounter
Admit: 2021-11-29 | Payer: PRIVATE HEALTH INSURANCE | Attending: Student in an Organized Health Care Education/Training Program | Primary: Internal Medicine

## 2021-11-29 DIAGNOSIS — G35 Multiple sclerosis: Secondary | ICD-10-CM

## 2021-11-29 NOTE — Progress Notes
Would you be able to fax over MRI C spine to [facility]? Thank you!

## 2021-12-06 MED FILL — FINGOLIMOD 0.5 MG CAPSULE: 0.5 mg | ORAL | 28 days supply | Qty: 12 | Fill #3 | Status: CP

## 2022-01-11 ENCOUNTER — Telehealth
Admit: 2022-01-11 | Payer: PRIVATE HEALTH INSURANCE | Attending: Student in an Organized Health Care Education/Training Program | Primary: Internal Medicine

## 2022-01-11 DIAGNOSIS — G35 Multiple sclerosis: Secondary | ICD-10-CM

## 2022-01-11 MED ORDER — FINGOLIMOD 0.5 MG CAPSULE
0.5 mg | ORAL_CAPSULE | ORAL | 3 refills | Status: AC
Start: 2022-01-11 — End: ?

## 2022-01-11 NOTE — Telephone Encounter
Medication Refill Safety Checks:Medication order: Gilenya 0.5 mg capsule - Take 1 capsule PO three times a week.Is this a new order? NoIf yes, was the previous order discontinued? Not ApplicableIf the order is being updated, did you update the instruction box? Not ApplicableWere the medication safety checks including the 5 rights of medication administration (Patient, medication name, dose, route, time/frequency) completed prior to sending the request to pharmacy? YesFor tapering/titrating dosages, were the medication dosages and schedules verified with a second nurse? Not ApplicableCorrect pharmacy? yes

## 2022-01-11 NOTE — Telephone Encounter
Patient is calling  Needs new prescription to be [facility]ed [facility] to [facility] for her fingolimod (GILENYA) 0.5 mg [employer] is no longer covering for the [facility] is also only looking for 12 pills per monthCV[facility] contact number is [phone] and their fx is [phone]

## 2022-01-11 NOTE — Telephone Encounter
Called 1x to schedule return appt with Dr.Epstein.

## 2022-01-11 NOTE — Telephone Encounter
Appointment scheduled for next avail. 05/28/2022

## 2022-01-16 ENCOUNTER — Telehealth
Admit: 2022-01-16 | Payer: PRIVATE HEALTH INSURANCE | Attending: Student in an Organized Health Care Education/Training Program | Primary: Internal Medicine

## 2022-01-16 NOTE — Telephone Encounter
Attempted to call pharmacy and insurance to see what the problem was.  Was put on hold for 20 minutes. Unable to wait any longer.  Will try again tomorrow.

## 2022-01-16 NOTE — Telephone Encounter
Melissa from [facility] called back to speak with RN Samara Deist. Attempted to transfer call, nurse unavailable.  Message sent to nursing pool, caller aware. Per Melissa medication fingolimod (GILENYA) 0.5 mg capsule Just got approved and she is all set. Per Efraim Kaufmann will contact patient to set up delivery

## 2022-01-16 NOTE — Telephone Encounter
Patient is calling back and she said that now [facility] is saying that she needs auth for the fingolimod (GILENYA) 0.5 mg capsule.  She said she wanted to speak to Dr Gwyneth Sprout.  Tried to warm transfer to nursing as patient has been getting [facility] at [facility].  She said she has been trying to get her medication for 13 days now and now she is out of medication. [phone]

## 2022-01-16 NOTE — Telephone Encounter
Confirmed with the pharmacy per MD she onlywants to order 12 capsules in a month.

## 2022-01-16 NOTE — Telephone Encounter
Received call from Lenee at [facility] requesting to clarify instructions for the following med...fingolimod (GILENYA) 0.5 mg capsule 12 capsule 2 01/11/2022   Sig - Route: Take 1 capsule (0.5 mg total) by mouth 3 (three) times a week. - Oral  States this med is typically a daily medication.Requests callback to confirm.T- [phone]

## 2022-01-16 NOTE — Telephone Encounter
Yeah intentional

## 2022-01-18 ENCOUNTER — Encounter
Admit: 2022-01-18 | Payer: PRIVATE HEALTH INSURANCE | Attending: Vascular and Interventional Radiology | Primary: Internal Medicine

## 2022-03-16 ENCOUNTER — Encounter
Admit: 2022-03-16 | Payer: PRIVATE HEALTH INSURANCE | Attending: Student in an Organized Health Care Education/Training Program | Primary: Internal Medicine

## 2022-03-16 DIAGNOSIS — G35 Multiple sclerosis: Secondary | ICD-10-CM

## 2022-03-16 MED ORDER — FINGOLIMOD 0.5 MG CAPSULE
0.5 mg | ORAL_CAPSULE | ORAL | 3 refills | Status: AC
Start: 2022-03-16 — End: 2022-03-21

## 2022-03-16 NOTE — Telephone Encounter
Medication Refill Safety Checks:Medication order: Gilenya 0.5 mg capsule - Take 1 capsule PO three times a week.Is this a new order? NoIf yes, was the previous order discontinued? Not ApplicableIf the order is being updated, did you update the instruction box? Not ApplicableWere the medication safety checks including the 5 rights of medication administration (Patient, medication name, dose, route, time/frequency) completed prior to sending the request to pharmacy? YesFor tapering/titrating dosages, were the medication dosages and schedules verified with a second nurse? Not ApplicableCorrect pharmacy? yes

## 2022-03-17 NOTE — Telephone Encounter
Patient stated she does not understand why she needs a PA every month for Gilenya (generic Fingolomoid). Patient stated she would like the PA to be good for one year. Best call back # [phone].

## 2022-03-21 MED ORDER — FINGOLIMOD 0.5 MG CAPSULE
0.5 mg | ORAL_CAPSULE | ORAL | 3 refills | 28.00 days | Status: AC
Start: 2022-03-21 — End: 2022-03-23
  Filled 2022-03-22: qty 12, 28d supply, fill #0
  Filled 2022-03-22: qty 12, 28d supply, fill #1

## 2022-03-23 ENCOUNTER — Encounter
Admit: 2022-03-23 | Payer: PRIVATE HEALTH INSURANCE | Attending: Student in an Organized Health Care Education/Training Program | Primary: Internal Medicine

## 2022-03-23 DIAGNOSIS — G35 Multiple sclerosis: Secondary | ICD-10-CM

## 2022-03-23 MED ORDER — FINGOLIMOD 0.5 MG CAPSULE
0.5 mg | ORAL_CAPSULE | ORAL | 3 refills | 28.00 days | Status: AC
Start: 2022-03-23 — End: 2022-03-31

## 2022-03-23 NOTE — Telephone Encounter
[facility] MESSAGETime of call:   2:29 PMCaller:   ArchanaCaller's relationship to patient:  Patent examiner from (pharmacy, hospital, agency, etc.):  [facility]    Reason for call:  Calling for clarification instructions on fingolimod (GILENYA) 0.5 mg capsule. The Pharmacy can only Dispense  30 capsules in quantity and they can not break the quantity in the bottle. Please send new Rx to fax#[phone] not feeling well, what are symptoms:  n/a   If having symptoms, how long have the symptoms been present:  n/a   Does caller request to speak to someone urgently?  no   If yes, warm transferred to:n/aProvider:  Dr. Marcy Panning clinic visit date:  2/16/23Best telephone number for callback:   [phone] time to return call (encourage patient to be available for callback):   anytime Permission to leave message:  yes   [facility] Scheduler

## 2022-03-24 ENCOUNTER — Encounter
Admit: 2022-03-24 | Payer: PRIVATE HEALTH INSURANCE | Attending: Student in an Organized Health Care Education/Training Program | Primary: Internal Medicine

## 2022-03-24 ENCOUNTER — Encounter: Admit: 2022-03-24 | Payer: PRIVATE HEALTH INSURANCE | Attending: Pharmacist | Primary: Internal Medicine

## 2022-03-24 DIAGNOSIS — G35 Multiple sclerosis: Secondary | ICD-10-CM

## 2022-03-24 DIAGNOSIS — D84821 Immunosuppression due to drug therapy (HC Code): Secondary | ICD-10-CM

## 2022-03-27 ENCOUNTER — Telehealth
Admit: 2022-03-27 | Payer: PRIVATE HEALTH INSURANCE | Attending: Pharmacist Clinician (PhC)/ Clinical Pharmacy Specialist | Primary: Internal Medicine

## 2022-03-27 NOTE — Telephone Encounter
PHARMACY NOTE: APPOINTMENTI have talked to Ms. Jennifer Clark on 03/27/2022 at 11:16 AM to make an appointment with [facility] Pharmacist. I have scheduled the patient:Appt Type: Phone ConsultPharmacist: Linwood Dibbles reason/Notes:Medication name: GilenyaInitial AppointmentPertinent notes from referral: Continuation of TheapyOrder Priority: RoutineDate/Time of Appt: 1/26 @ 2PDuration of Appt: 60 minutesAshley Emalynn Clark, CPHTMedication Management Clinic [facility] Main Phone: [phone]

## 2022-03-31 ENCOUNTER — Ambulatory Visit: Admit: 2022-03-31 | Payer: PRIVATE HEALTH INSURANCE | Attending: Pharmacist | Primary: Internal Medicine

## 2022-03-31 ENCOUNTER — Encounter: Admit: 2022-03-31 | Payer: PRIVATE HEALTH INSURANCE | Attending: Pharmacist | Primary: Internal Medicine

## 2022-03-31 DIAGNOSIS — G35 Multiple sclerosis: Secondary | ICD-10-CM

## 2022-03-31 DIAGNOSIS — I10 Essential (primary) hypertension: Secondary | ICD-10-CM

## 2022-03-31 MED ORDER — FINGOLIMOD 0.5 MG CAPSULE
0.5 mg | ORAL_CAPSULE | ORAL | 6 refills | 28.00 days | Status: AC
Start: 2022-03-31 — End: ?
  Filled 2022-06-06: qty 12, 28d supply, fill #0

## 2022-03-31 MED ORDER — CHOLECALCIFEROL (VITAMIN D3) 125 MCG (5,000 UNIT) TABLET
125 mcg (5,000 unit) | Freq: Every day | ORAL | Status: AC
Start: 2022-03-31 — End: ?

## 2022-03-31 NOTE — Patient Instructions
Plan:Continue fingolimod (Gilenya) 0.5mg  by mouth three times weeklyObtain [facility] Gwyneth Sprout 3/21/24Medications ordered: fingolimod (Gilenya) 0.5mg  capsules, #12, 5 refillsLabs ordered: NonePharmacist Follow-Up: in 6 month(s), 09/29/22 11:00AM

## 2022-03-31 NOTE — Progress Notes
PHARMACY COMPREHENSIVE MEDICATION MANAGEMENT EVALUATIONEncounter Date: 1/26/2024Referring/Attending Physician: Dr. Montez Morita is referred for collaborative medication management of NeurologySubjective: Jennifer Clark is a 62 y.o. female who is seen by the clinic pharmacist for a(n) telephone visit. The purpose of today's visit is to establish care for the management of fingolimod (Gilenya).Neurology ManagementThe patient has a diagnosis of multiple sclerosis and is referred to the pharmacist for collaborative management.Disease state history:?	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	Date of last relapse: 2010?	MS symptoms have included sensory symptoms including paresthesias.   No data to display    Current Neurology Regimen(s): Therapy Start Date of Therapy Medication History/Notes Fingolimod (Gilenya) 0.5mg  by mouth three times weekly 02/2014 In 2020 she started taking Gilenya?3 times a week due to persistent lymphopenia Does the patient feel the treatment regimen is effective? YesCDC HRQOL-4 Score: Unhealthy days = 0 (03/31/2022  2:26 PM)CDC HRQOL-4 Score: Healthy days = 30 (03/31/2022  2:26 PM) Multiple Sclerosis Clinical Screening - 03/31/22 1412    Therapy Start Confirmation  Has the patient started currently prescribed regimen for MS? Y    Clinical Evaluation, Updates, ADLs  Did the patient experience any MS symptoms in the past 4 weeks? N   Does or did patient take med(s) to control the experienced symptoms?  N   Have the symptoms improved in the last 4 weeks? N   In the past 4 weeks - missed days from work due to MS symptoms: 0   In the past 4 weeks - missed days from school due to MS symptoms: N/A   In the past 4 weeks - missed days from planned activities due to MS symptoms: 0   Urgent care visit due to MS symptoms? No urgent care visit needed     Previous Neurology Regimen(s): Therapy Date(s) of Therapy Medication History/Notes Interferon (Avonex) 2004 - 2015 Flu-like symptoms, injection fatigue, relapse  Medication ManagementMedication-related side effects: No reported side effects. 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 Medication storage:The patient reports storing medications on countertop.Affordability:The patient denies concerns with affordability of medications. Patient reports a $10.Preferred/Specialty pharmacy:Patient reports filling all specialty medications [facility].Medication history:A medication history was obtained from the patient.Allergies, problem list, medications, and immunizations were reviewed this visit.Objective: The following labs were reviewed during today's visit: CBC w/diff and CMPAssessment/Plan: Neurology Medication Therapy Assessment:Jennifer Clark feels the same on the current neurology medication regimen and is taking their regimen as prescribed. Patient has been stable on fingolimod (Gilenya) for 9 years. For the last years, patient reports she has been taking fingolimod (Gilenya) 0.5mg  three times a week. The decision to decrease the dose was made by Dr. Michaell Cowing in the setting of disease stability and low lymphocyte counts. Patient's lymphocyte count has remained low, but is stable. Over the past few months, patient reports difficulty obtaining refills to her medication, because she has had a hard time getting through to the office. Going forward, I will manage patient lab monitoring and refills. Goals Addressed       This Visit's Progress  ? RxSp Therapeutic Goal   On track   Multiple Sclerosis: Reduce or maintain frequency of exacerbationsMTPs must be opened for patients not making appropriate progress towards their established therapeutic goals.Patient's progress towards goal: Patient is clinically and radiographically stable with no new or worsening neurological symptoms on fingolimod (Gilenya)   Medication Therapy Recommendations Medication Therapy RecommendationsNo medication therapy  recommendations to display Monitoring:Patient is not up to date with recommended specialty medication monitoring. Labs ordered and patient plans to get them done the week of 04/21/22. Fingolimod (Gilenya)Monitoring Parameter Recommended Frequency Last Obtained Next Due CBC w/ differential  Baseline, 1, 3, and 6 months after initiation of therapy, then every 6 months 2/22/23ALC 373, 371 Due Now LFTs Baseline, 1, 3, and 6 months after initiation of therapy, then every 6 months 2/22/23ALT 36 Due Now ECG  Baseline  N/A Ophthalmologic exams Baseline, and 3-4 months after initiation, then annually Patient has annual eye exams at [facility] (Dr. Margaretha Glassing)  VZV titer Baseline, if no history of chickenpox or VZV vaccination Never done N/A Immunizations:The patient is up to date on immunizations as recommended by the CDC. Recommend the following immunizations for the patient:Other: RSV Discussed benefits and recommendations.Comprehensive Medication ManagementThe patient is taking medications as prescribed. Cost of medications is not a barrier. Patient is not experiencing medication related side effects. The patient is storing medications correctly. Adherence education was not provided; recommended: none necessary. The patient does need refills of medications at this time and refills were sent.Drug-drug interactions communicated to the referring clinician: NoneDose adjustment recommendations communicated to the referring clinician: No adjustments required at this timePlan:?	Continue fingolimod (Gilenya) 0.5mg  by mouth three times weekly?	Obtain labs at [facility]?	See Dr. Gwyneth Sprout 3/21/24Medications ordered: ?	fingolimod (Gilenya) 0.5mg  capsules, #12, 5 refillsLabs ordered: NonePharmacist Follow-Up: in 6 month(s), 09/29/22 11:00AMPatient Education:  The following education was provided prior to sending refills: Assessment of efficacy and safety of drug therapy and Adverse effect presentation, evaluation, and monitoring during today?s visit to the patient regarding medications and multiple sclerosis management: proper administration of medications, purpose of each medication, proper storage of medications, side effects of medications and importance of adherence with the regimen(s)Medication Education  Proper Use/How To Administer:?	Medication Name: Fingolimod (Gilenya)?	Dose/Directions: 0.5mg  by mouth once daily --> patient is taking 3 times weekly due to low lymphocyte count?	Importance of lab monitoring prior to starting and while on medications?	OTC and prescription drug interactions: appropriate counseling was provided to the patient to contact the pharmacist or clinician if a new medication is started.Duration of Therapy:  ?	Patient should take this medication indefinitelyCommon/Severe side effects, mitigation strategy, and safety precautions:?	Increased risk for infections (avoid sick contacts, stay up to date on vaccinations, wash hands thoroughly, avoid large cuts to the skin)?	High blood pressure (check your blood pressure regularly; record measurements for your provider to bring to appointments)Missed dose education:?	If you miss a dose of this drug, take it as soon as you think about it. Then go back to your normal time.?	Do not take 2 doses at the same time or extra doses.?	If you miss 4 doses, call your doctor.Patient Specific Contraindications: ?	No contraindications were identified for this patient Disposal:?	Non-Hazardous Medications:?	Do not dispose in the toilet or sink?	If disposing via the trash:?	Keep medication in original container and cross out any patient identifying information?	Modify the medication to discourage consumption ?	For pills/capsules: add small amount of water to partially dissolve?	For liquids: add salt, flour, kitty litter, etc. to make pungent?	For blister packs: wrap pack in multiple layers of duct tape?	Seal and conceal - tape shut, place in non-transparent bag (do NOT conceal with food products)?	Discard in trash can?	Drug take back programs: [place] list of police departments with drop boxes in their waiting rooms:  TelephonePost.uy.asp?q=501922 Safe Handling:?	No specific considerations are applicable for this medicationStorage and Handling:?	Store at room temperature, in a cool dry place away from heat, moisture and sunlight.Infection Control:?	Appropriate counseling was done on keeping vaccinations up to date and avoiding  any sick contacts while on an immunomodulatory medication.  Time Spent: 23 minutes, telephone consultPATIENT AND/OR CAREGIVER VERBALIZED UNDERSTANDING OF CARE PLAN AND PATIENT GOALS: yesPATIENT ADVISED TO CALL BACK WITH QUESTIONS, CONCERNS, OR CHANGE IN SYMPTOMS.Wynonia Sours, PharmD, BCPSAmbulatory Care Clinical Pharmacist II, Neurology [facility] HealthTELEPHONE VISIT: For this visit the clinician and patient were present via telephone (audio only).Patient counseled on available options for visit type; Patient elected telephone visit (audio only); Patient consent given for telephone (audio only) visit : YesState patient is located in: [place]The clinician is appropriately licensed in the above state to provide care for this visitPatient Identity was confirmed during this call.  Other individuals actively participating in the telephone encounter and their name/relation to the patient: noneTotal time spent in medical telephone (audio only) discussion: 23 minutesBecause this visit was completed over telephone, a hands-on physical exam was not performed.  Patient understands and knows to call back if condition changes.

## 2022-04-18 IMAGING — MR MRI LEFT KNEE WITHOUT CONTRAST
4 of 5 series · 24 of 40 positions shown · IV contrast (gadolinium)
Comparison: None

________________________________________________________________________________________________ 
MRI LEFT KNEE WITHOUT CONTRAST, 04/18/2022 [DATE]: 
CLINICAL INDICATION: Complex tear of medial meniscus, current injury, left knee, 
initial encounter.
TECHNIQUE: Multiplanar, multiecho position MR images of the knee were performed 
without intravenous gadolinium enhancement. Patient was scanned on a
magnet.

[Series 301: PD fat-sat · axial · left · 3.0mm · 0.36mm/px · z∈[-82,+44]mm · 8 of 36 slices shown (1 of 3)]
[im 1/36]
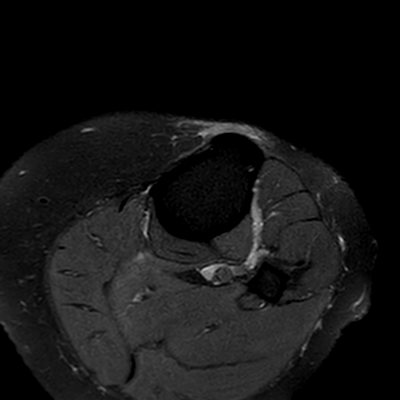
[im 4/36]
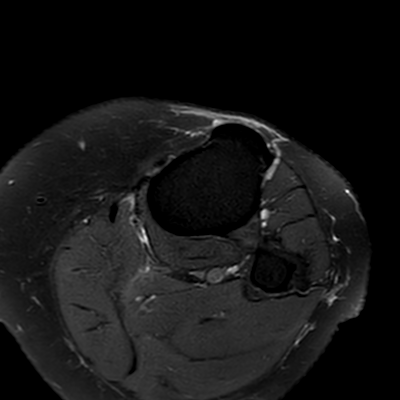
[im 12/36]
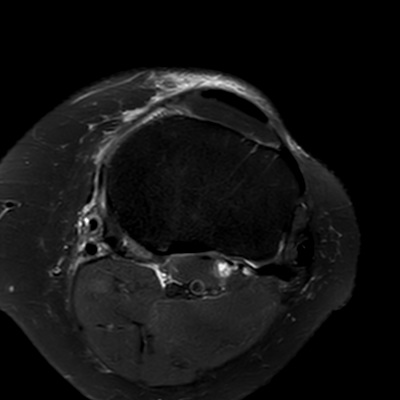
[im 16/36]
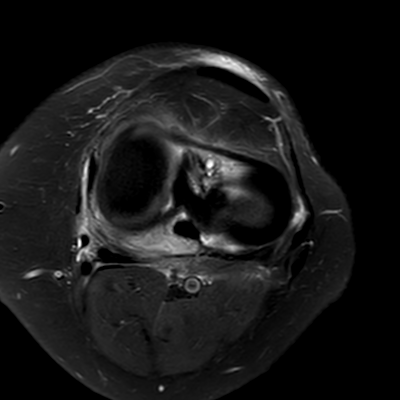
[im 20/36]
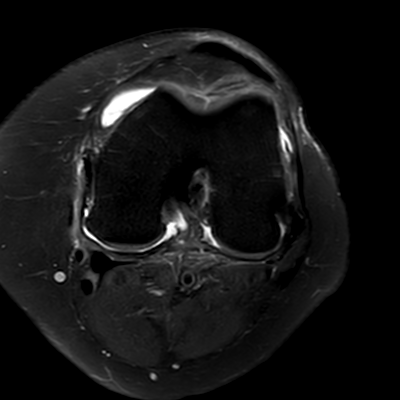
[im 24/36]
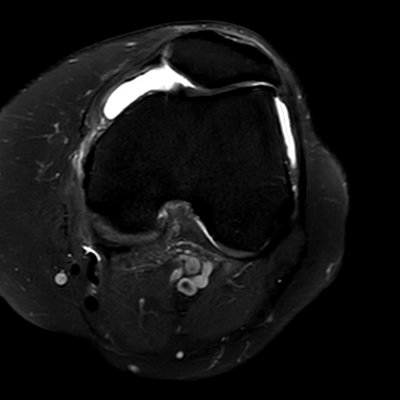
[im 32/36]
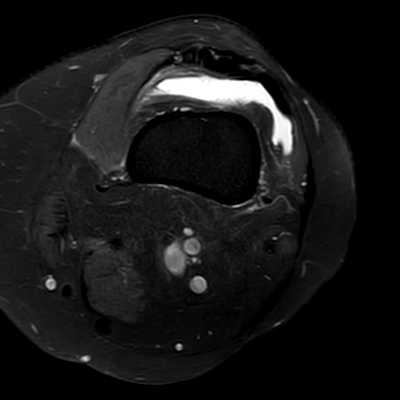
[im 36/36]
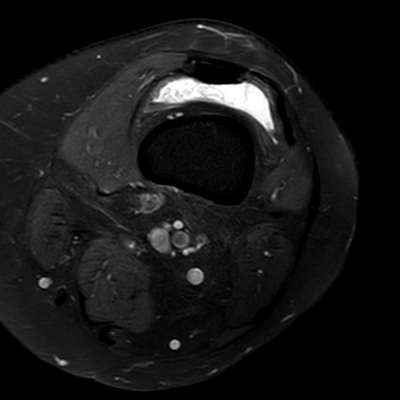

[Series 401: PD fat-sat · sagittal · left · 3.0mm · 0.29mm/px · 9 of 32 slices shown (2 of 3)]
[im 1/32]
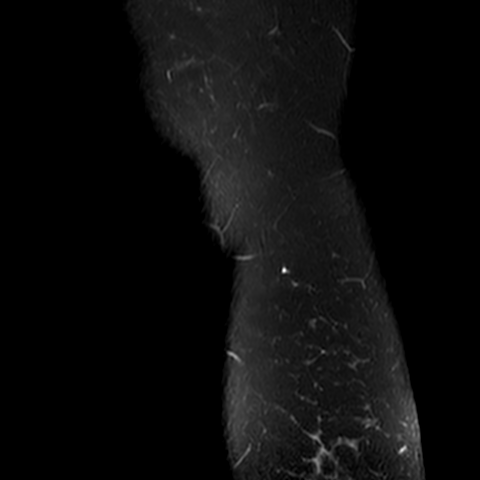
[im 4/32]
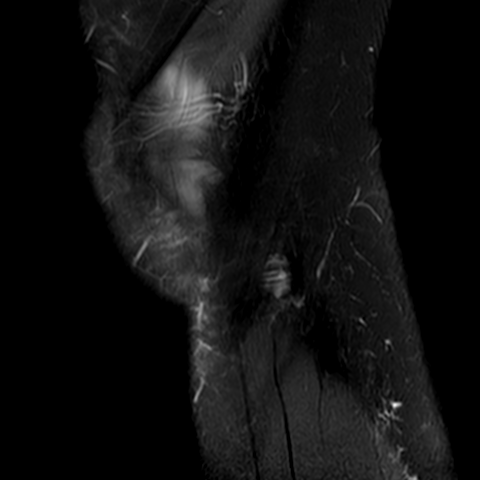
[im 8/32]
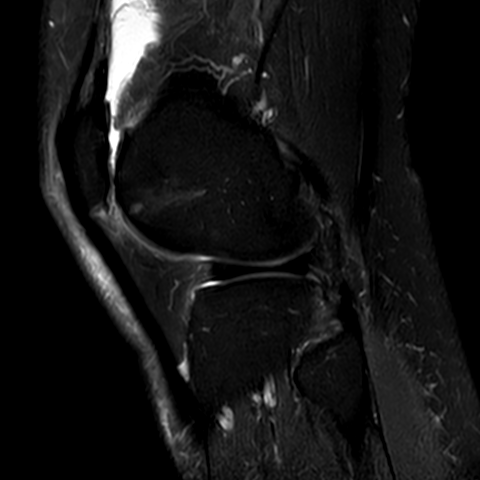
[im 12/32]
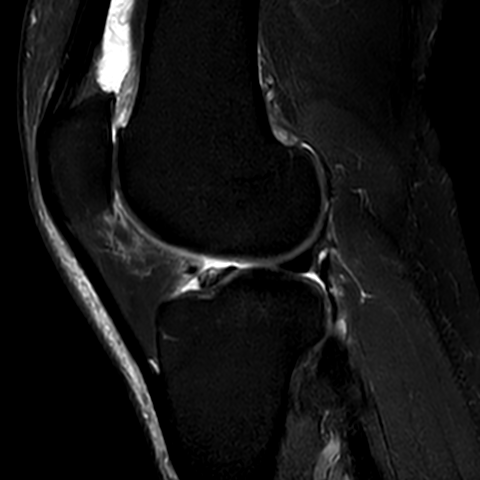
[im 16/32]
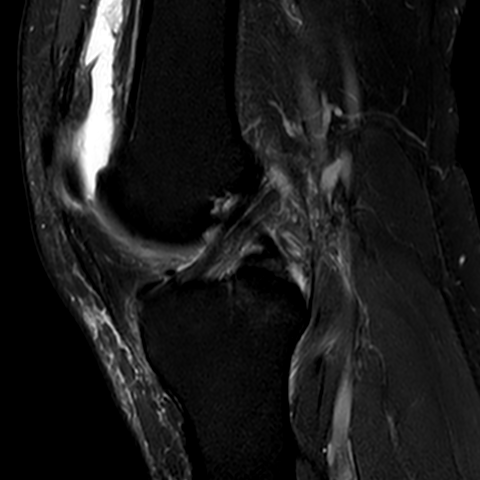
[im 20/32]
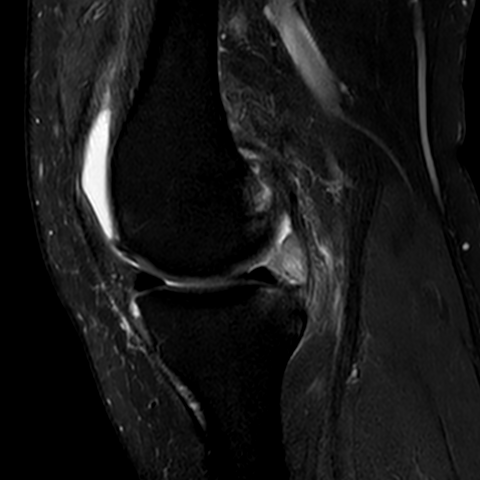
[im 24/32]
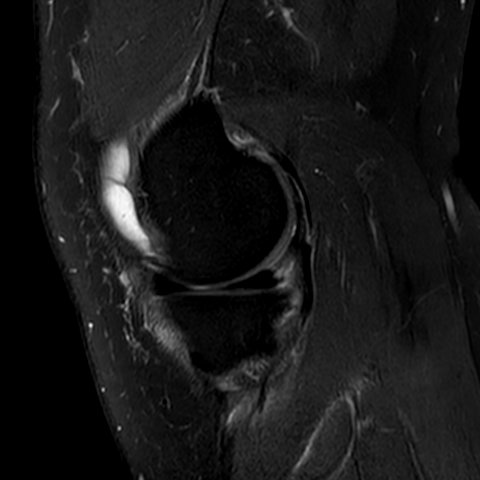
[im 28/32]
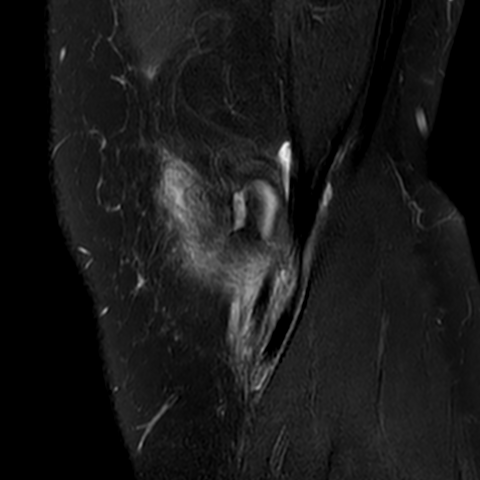
[im 32/32]
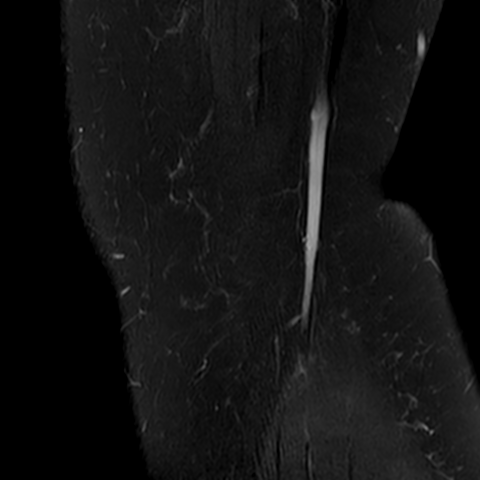

[Series 501: T1 · coronal · left · 3.0mm · 0.31mm/px · 3 of 32 slices shown]
[im 4/32]
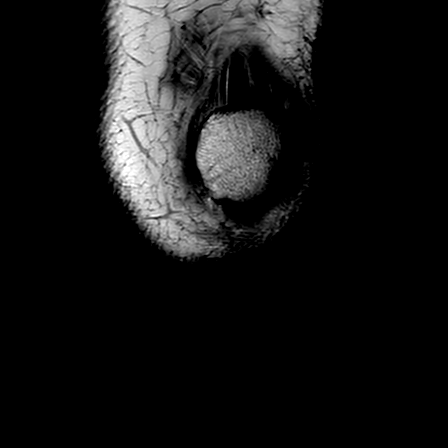
[im 16/32]
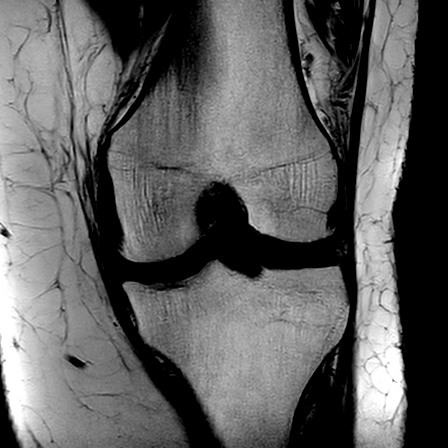
[im 28/32]
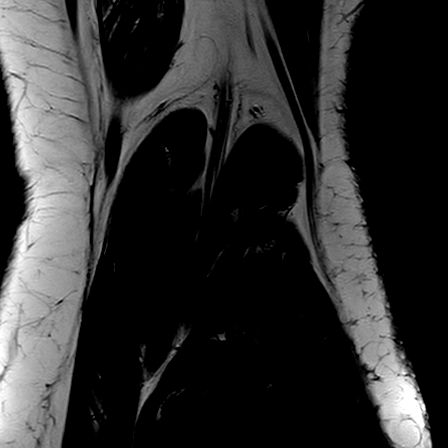

[Series 601: PD fat-sat · coronal · left · 3.0mm · 0.31mm/px · 4 of 32 slices shown (3 of 3)]
[im 1/32]
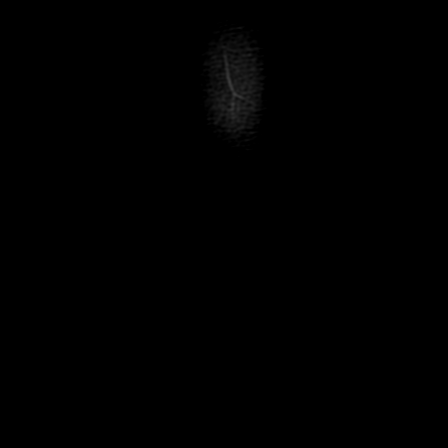
[im 4/32]
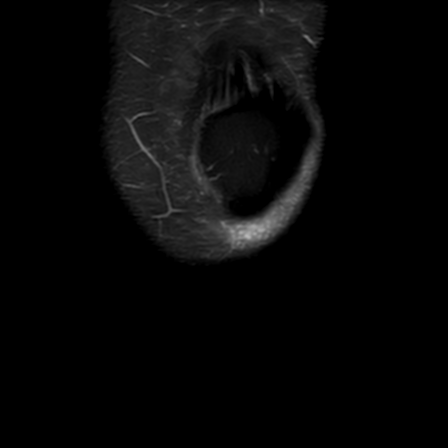
[im 16/32]
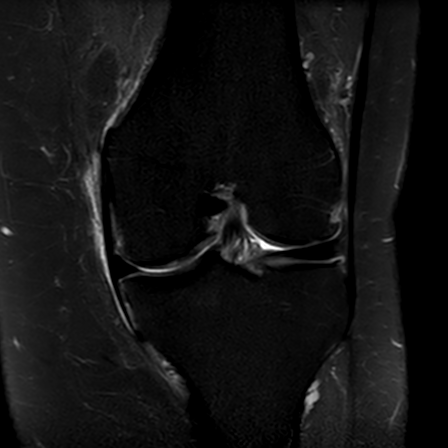
[im 28/32]
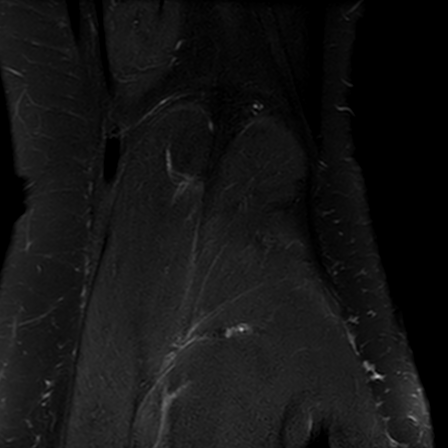

[24 of 40 positions shown; findings below may reference images not displayed]

FINDINGS: MEDIAL COMPARTMENT: Horizontal tear of the posterior horn of the medial meniscus 
with small anteriorly displaced fragment. Up to grade II chondromalacia. 2.7 x 
1.0 x 2.0 cm focus of intermediate signal intensity synovitis in the posterior 
joint space. Tiny osteophytes. 
LATERAL COMPARTMENT: The lateral meniscus is intact without tear or extrusion. 
Articular cartilage is preserved. Tiny osteophytes. 
PATELLOFEMORAL COMPARTMENT: The patella is centrally located. Up to grade IV 
chondromalacia and subcortical cystic change of the lateral trochlea. Up to 
grade III chondromalacia patella. Osteophytes.  
TIBIOFIBULAR COMPARTMENT: Negative. 
LIGAMENTS: The anterior cruciate ligament (ACL) is intact. 0.6 cm multiloculated 
ganglion anterior to the distal ACL. The posterior cruciate ligament is intact. 
The medial collateral ligament and lateral collateral ligaments are preserved. 
EXTENSOR MECHANISM: The quadriceps and patellar tendon are preserved. The medial 
and lateral retinacula are intact. 
POSTEROMEDIAL CORNER: The semimembranosus and pes anserine tendons are 
preserved. The posterior oblique ligament and posterior medial joint capsule are 
intact. 
POSTEROLATERAL CORNER: The popliteal tendon and popliteofibular ligament are 
intact. The biceps femoris is negative. 
BONES: No fracture. Minimal bone marrow edema in the posterior medial tibia deep 
to the posterior medial meniscal root ligament attachment. 
ADDITIONAL FINDINGS: Moderate joint effusion and mild synovitis. No popliteal 
cyst. The musculature is normal without mass, signal abnormality or atrophy. 
Neurovascular bundles are negative. Mild deep medial and anterior subcutaneous 
soft tissue swelling.
IMPRESSION: 1.  Horizontal tear of the posterior horn of the medial meniscus with small 
anteriorly displaced fragment.  
2.  Moderate patellofemoral and mild medial/lateral compartment degenerative 
change. 
3.  2.7 x 1.0 x 2.0 cm focus of synovitis in the posterior medial joint space.  
4.  Moderate joint effusion, mild synovitis, 0.6 cm multiloculated ganglion 
anterior to the distal ACL and mild soft tissue swelling.

## 2022-04-19 ENCOUNTER — Telehealth: Admit: 2022-04-19 | Payer: PRIVATE HEALTH INSURANCE | Primary: Internal Medicine

## 2022-04-19 NOTE — Telephone Encounter
Pt called to make sure [facility] were [facility]. Confirmed they were-Pt also asked if she need to fast for them-Pharmacist said no and she will be getting labs drawn Monday 2/19.Triston Skare, CPHTMCarole Civilement Clinic Technician	[facility] Main Phone: [phone] AM2/14/2024

## 2022-04-25 ENCOUNTER — Encounter: Admit: 2022-04-25 | Payer: PRIVATE HEALTH INSURANCE | Attending: Pharmacist | Primary: Internal Medicine

## 2022-04-25 ENCOUNTER — Telehealth
Admit: 2022-04-25 | Payer: PRIVATE HEALTH INSURANCE | Attending: Pharmacist Clinician (PhC)/ Clinical Pharmacy Specialist | Primary: Internal Medicine

## 2022-04-25 NOTE — Telephone Encounter
Spoke to [facility] and she said some of the patients labs arent resulted yet but she will fax over what has been completed.Merlinda Frederick, CPHTMedication Management Clinic Technician	[facility] Main Phone: [phone] PM2/20/2024

## 2022-04-27 ENCOUNTER — Encounter: Admit: 2022-04-27 | Payer: PRIVATE HEALTH INSURANCE | Attending: Pharmacist | Primary: Internal Medicine

## 2022-04-27 ENCOUNTER — Encounter: Admit: 2022-04-27 | Payer: PRIVATE HEALTH INSURANCE | Primary: Internal Medicine

## 2022-04-27 NOTE — Telephone Encounter
Spoke to [place] at [place] and he will re-fax over patients final results from labs collected. He mentioned that some tests were still pending.Jennifer Clark, CPHTMedication Management Clinic Technician	[facility] Main Phone: [phone] PM2/22/2024

## 2022-04-28 LAB — IMMUNOGLOBULINS IGG, IGA, IGM
IMMUNOGLOBULIN G: 1059 mg/dL (ref 600–1540)
IMMUNOGLOBULIN M: 61 mg/dL (ref 50–300)

## 2022-04-28 LAB — LYMPHOCYTE SUBSET PANEL 2     (LMW Q)
% CD19 (B CELLS): 6 % (ref 6–29)
% CD3 (MATURE T CELLS): 21 % — ABNORMAL LOW (ref 57–85)
ABSOLUTE CD19+ CELLS: 23 cells/uL — ABNORMAL LOW (ref 110–660)
BKR ASPARTATE AMINOTRANSFERASE (AST): 329 cells/uL — ABNORMAL LOW (ref 850–3900)
CD3 ABSOLUTE: 71 {cells}/uL — ABNORMAL LOW (ref 840–3060)
CD4 T CELL ABSOLUTE: 17 cells/uL — ABNORMAL LOW (ref 490–1740)
CD8 % SUPPRESSOR T CELL: 16 % (ref 12–42)
HELPER/SUPPRESSOR RATIO: 0.35 mg/dL — ABNORMAL LOW (ref 0.86–5.00)
IMMUNOGLOBULIN A: 71 cells/uL — ABNORMAL LOW (ref 840–3060)
LYMPHOCYTES ABSOLUTE COUNT: 329 {cells}/uL — ABNORMAL LOW (ref 850–3900)

## 2022-04-28 LAB — STRATIFY JCV AB (W/INDEX) W/REFL TO INHIBITION ASSAY (BH GH LMW Q YH)
CD4 % HELPER T CELL: 2.82 % — ABNORMAL HIGH (ref 30–61)
JCV ANTIBODY: POSITIVE — AB
JCV INDEX: 2.82 — ABNORMAL HIGH

## 2022-05-01 NOTE — Progress Notes
Monitoring:Patient is up to date with recommended specialty medication monitoring. 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

## 2022-05-18 ENCOUNTER — Encounter: Admit: 2022-05-18 | Payer: PRIVATE HEALTH INSURANCE | Primary: Internal Medicine

## 2022-05-25 ENCOUNTER — Encounter
Admit: 2022-05-25 | Payer: PRIVATE HEALTH INSURANCE | Attending: Student in an Organized Health Care Education/Training Program | Primary: Internal Medicine

## 2022-07-06 ENCOUNTER — Encounter: Admit: 2022-07-06 | Payer: PRIVATE HEALTH INSURANCE | Primary: Internal Medicine

## 2022-07-06 NOTE — Progress Notes
Specialty Pharmacy Medication Dispense Coordination Note Jennifer Clark DOB 02-Feb-1961 was set up for medications to be dispensed by the Specialty Pharmacy. Associated Specialty Program(s): Rx Multiple Sclerosis Refill Coordination   Row Name 07/06/22 1315   Validate patient using 2 patient identifiers.  Medications Linked to Program Fingolimod Hcl  Person authorizing delivery? patient  Patient reported number of doses remaining? 10  How many doses has the patient missed since their last refill? 0  Patient/caregiver confirms patient is taking medication appropriately (i.e. dose, route, frequency)? Y  Has the patient been experiencing any side effects? N  Has the patient started any new medications or developed any new allergies or medical conditions? N  Has the patient had any unplanned doctor appointments, urgent care, ED, or hospital admissions in the last 4 weeks? N  Are there any medications due (specialty or non-specialty) that are not being filled? N  Patient/caregiver authorized copay amount and payment method? Y  Confirmed delivery address and phone number? Y  Does the patient/caregiver have any questions for a pharmacist? N    Jennifer Clark, CPHTPharmacy TechnicianMay 2, 20241:16 PMOutpatient Pharmacy Services at [facility] any questions, please call:  [phone]

## 2022-07-17 MED FILL — FINGOLIMOD 0.5 MG CAPSULE: 0.5 mg | ORAL | 28 days supply | Qty: 12 | Fill #1 | Status: CP

## 2022-08-16 MED FILL — FINGOLIMOD 0.5 MG CAPSULE: 0.5 mg | ORAL | 28 days supply | Qty: 12 | Fill #2 | Status: CP

## 2022-09-15 MED FILL — FINGOLIMOD 0.5 MG CAPSULE: 0.5 mg | ORAL | 28 days supply | Qty: 12 | Fill #3 | Status: CP

## 2022-09-28 ENCOUNTER — Telehealth: Admit: 2022-09-28 | Payer: PRIVATE HEALTH INSURANCE | Primary: Internal Medicine

## 2022-09-28 NOTE — Telephone Encounter
PHARMACY NOTE: APPOINTMENTI have talked to Ms. Jennifer Clark on 09/28/2022 at 2:24 PM to make an appointment with [facility] Pharmacist. Patient called to reschedule.I have scheduled the patient:Appt Type: Phone ConsultPharmacist: Linwood Dibbles reason/Notes:Medication name: GilenyaFollow-up AppointmentPertinent notes from referral: Order Priority: RoutineDate/Time of Appt: 10/04/2022 @ 10:30 amDuration of Appt: 30 minutesMedication history has NOT been completedAshley Kadasia Kassing, CPHTMedication Management Clinic [facility] Main Phone: [phone]

## 2022-09-29 ENCOUNTER — Ambulatory Visit: Admit: 2022-09-29 | Payer: PRIVATE HEALTH INSURANCE | Attending: Pharmacist | Primary: Internal Medicine

## 2022-10-04 ENCOUNTER — Ambulatory Visit: Admit: 2022-10-04 | Payer: PRIVATE HEALTH INSURANCE | Attending: Pharmacist | Primary: Internal Medicine

## 2022-10-04 ENCOUNTER — Encounter: Admit: 2022-10-04 | Payer: PRIVATE HEALTH INSURANCE | Attending: Pharmacist | Primary: Internal Medicine

## 2022-10-04 DIAGNOSIS — G35 Multiple sclerosis: Secondary | ICD-10-CM

## 2022-10-04 DIAGNOSIS — Z79899 Other long term (current) drug therapy: Secondary | ICD-10-CM

## 2022-10-04 DIAGNOSIS — I10 Essential (primary) hypertension: Secondary | ICD-10-CM

## 2022-10-04 MED ORDER — FINGOLIMOD 0.5 MG CAPSULE
0.5 mg | ORAL_CAPSULE | ORAL | 6 refills | 28.00 days | Status: AC
Start: 2022-10-04 — End: ?
  Filled 2022-10-12: qty 12, 28d supply, fill #0

## 2022-10-04 NOTE — Progress Notes
PHARMACY COMPREHENSIVE MEDICATION MANAGEMENT EVALUATIONEncounter Date: 7/31/2024Referring/Attending Physician: Dr. Montez Morita is referred for collaborative medication management of NeurologySubjective: Jennifer Clark is a 62 y.o. female who is seen by the clinic pharmacist for a(n) telephone visit. 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 YesCDC HRQOL-4 Score: Unhealthy days = 0 (10/04/2022 10:36 AM)CDC HRQOL-4 Score: Healthy days = 30 (10/04/2022 10:36 AM) Multiple Sclerosis Clinical Screening - 10/04/22 1032    Therapy Start Confirmation  Has the patient started currently prescribed regimen for MS? Y    Clinical Evaluation, Updates, ADLs  Did the patient experience any MS symptoms in the past 4 weeks? N   Does or did patient take med(s) to control the experienced symptoms?  N   Have the symptoms improved in the last 4 weeks? N   In the past 4 weeks - missed days from work due to MS symptoms: 0   In the past 4 weeks - missed days from school due to MS symptoms: N/A   In the past 4 weeks - missed days from planned activities due to MS symptoms: 0   Urgent care visit due to MS symptoms? No urgent care visit needed     Previous Neurology Regimen(s): Therapy Date(s) of Therapy Medication History/Notes Interferon (Avonex) 2004 - 2015 Flu-like symptoms, injection fatigue, relapse  Medication ManagementMedication-related side effects: No reported side effects. 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 Medication storage:The patient reports storing medications on countertop.Affordability:The patient denies concerns with affordability of medications. Patient has a $10 copay.Preferred/Specialty pharmacy:Patient reports filling all specialty [facility].I have reviewed the information collected by pharmacy technicians for all Refill Coordination Outreach Assessments since the last pharmacist Clinical Assessment Follow-Up.Medication history:A medication history was obtained from the patient.Allergies, problem list, medications, and immunizations were reviewed this visit.Objective: The following labs were reviewed during today's visit: CBC w/diff and CMPAssessment/Plan: Neurology Medication Therapy Assessment:[name] feels the same on the current neurology medication regimen and is taking their regimen as prescribed. Patient has been stable on fingolimod (Gilenya) for 9 years. For the last years, patient reports she has been taking fingolimod (Gilenya) 0.5mg  three times a week. The decision to decrease the dose was made by Dr. Michaell Cowing in the setting of disease stability and low lymphocyte counts. Patient's lymphocyte count has remained low, but is stable. Plan on continuing current regimen for now. Goals Addressed       This Visit's Progress   RxSp Therapeutic Goal   On track   Multiple Sclerosis: Reduce or maintain frequency of exacerbationsMTPs must be opened for patients not making appropriate progress towards their established therapeutic goals.Patient's progress towards goal: Patient is clinically and radiographically stable with no new or worsening neurological symptoms on fingolimod (Gilenya)   Medication  Therapy Recommendations Medication Therapy RecommendationsNo medication therapy recommendations to display Monitoring:Patient is up to date with recommended specialty medication monitoring. 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 Fingolimod (Gilenya)Monitoring Parameter Recommended Frequency Last Obtained Next Due CBC w/ differential  Baseline, 1, 3, and 6 months after initiation of therapy, then every 6 months 2/19/24ALC 351 10/2022 LFTs Baseline, 1, 3, and 6 months after initiation of therapy, then every 6 months 2/19/24ALT 45 10/2022 ECG  Baseline   N/A Ophthalmologic exams Baseline, and 3-4 months after initiation, then annually Patient has annual eye exams at [facility] (Dr. 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 Recommend the following immunizations for the patient:Other: RSV  Deferred discussion at this visitComprehensive Medication ManagementThe patient is taking medications as prescribed. Cost of medications is not a barrier. Patient is not experiencing medication related side effects. The patient is storing medications correctly. Adherence education was not provided; recommended: none necessary. The patient does need refills of medications at this time and refills were sent.Drug-drug interactions communicated to the referring clinician: NoneDose adjustment recommendations communicated to the referring clinician: No adjustments required at this timePlan:Continue fingolimod (Gilenya) 0.5mg  by mouth three times weeklyO[facility] [place] [facility] G[facility] Sprout 12/5/24Medications ordered: fingolimod (Gilenya) 0.5mg  capsules, #12, 5 refillsLabs ordered: CBC with differential, CMP, JCV titerPharmacist Follow-Up: in 6 month(s), TBD, patient was disconnectedPatient Education:  The following education was provided  prior to sending refills : Assessment of efficacy and safety of drug therapy and Adverse effect presentation, evaluation, and monitoring during today?s visit to the patient regarding medications and multiple sclerosis management: proper administration of medications, purpose of each medication, proper storage of medications, side effects of medications and importance of adherence with the regimen(s)Medication Education  Proper Use/How To Administer:Medication Name: Fingolimod (Gilenya)Dose/Directions: 0.5mg  by mouth once daily --> patient is taking 3 times weekly due to low lymphocyte countImportance of lab monitoring prior to starting and while on medicationsOTC and prescription drug interactions: appropriate counseling was provided to the patient to contact the pharmacist or clinician if a new medication is started.Duration of Therapy:  Patient should take this medication indefinitelyCommon/Severe side effects, mitigation strategy, and safety precautions:Increased risk for infections (avoid sick contacts, stay up to date on vaccinations, wash hands thoroughly, avoid large cuts to the skin)High blood pressure (check your blood pressure regularly; record measurements for your provider to bring to appointments)Missed dose education:If you miss a dose of this drug, take it as soon as you think about it. Then go back to your normal time.Do not take 2 doses at the same time or extra doses.If you miss 4 doses, call your doctor.Patient Specific Contraindications: No contraindications were identified for this patient Disposal:Non-Hazardous Medications:Do not dispose in the toilet or sinkIf disposing via the trash:Keep medication in original container and cross out any patient identifying informationModify the medication to discourage consumption For pills/capsules: add small amount of water to partially dissolveFor liquids: add salt, flour, kitty litter, etc. to make pungentFor blister packs: wrap pack in multiple layers of duct tapeSeal and conceal - tape shut, place in non-transparent bag (do NOT conceal with food products)Discard in trash canDrug take back programs: [place] list of police departments with drop boxes in their waiting rooms:  [email or web address] Safe Handling:No specific considerations  are applicable for this medicationStorage and Handling:Store at room temperature, in a cool dry place away from heat, moisture and sunlight.Infection Control:Appropriate counseling was done on keeping vaccinations up to date and avoiding any sick contacts while on an immunomodulatory medication.  Time Spent: 5 minutes, telephone consultPATIENT AND/OR CAREGIVER VERBALIZED UNDERSTANDING OF CARE PLAN AND PATIENT GOALS: yesPATIENT ADVISED TO CALL BACK WITH QUESTIONS, CONCERNS, OR CHANGE IN SYMPTOMS.Wynonia Sours, PharmD, BCPSAmbulatory Care Clinical Pharmacist II, Neurology [facility] HealthTELEPHONE VISIT: For this visit the clinician and patient were present via telephone (audio only).Patient counseled on available options for visit type; Patient elected telephone visit (audio only); Patient consent given for telephone (audio only) visit : YesState patient is located in: [place]The clinician is appropriately licensed in the above state to provide care for this visitPatient Identity was confirmed during this call.  Other individuals actively participating in the telephone encounter and their name/relation to the patient: noneTotal time spent in medical telephone (audio only) discussion: 5 minutesBecause this visit was completed over telephone, a hands-on physical exam was not performed.  Patient understands and knows to call back if condition changes.

## 2022-10-04 NOTE — Patient Instructions
Plan:Continue fingolimod (Gilenya) 0.5mg  by mouth three times weeklyObtain [facility] [place] [facility] Gwyneth Sprout 12/5/24Medications ordered: fingolimod (Gilenya) 0.5mg  capsules, #12, 5 refillsLabs ordered: CBC with differential, CMP, JCV titerPharmacist Follow-Up: in 6 month(s), TBD, patient was disconnected

## 2022-10-17 ENCOUNTER — Encounter: Admit: 2022-10-17 | Payer: PRIVATE HEALTH INSURANCE | Primary: Internal Medicine

## 2022-10-17 ENCOUNTER — Telehealth
Admit: 2022-10-17 | Payer: PRIVATE HEALTH INSURANCE | Attending: Pharmacist Clinician (PhC)/ Clinical Pharmacy Specialist | Primary: Internal Medicine

## 2022-10-17 NOTE — Telephone Encounter
LVM for patient to [facility]Merlinda Frederick, CPHTMedication Management Clinic Technician	[facility] Main Phone: [phone] PM8/13/2024

## 2022-10-17 NOTE — Telephone Encounter
PHARMACY NOTE: SCHEDULING MISSED CALL 	I have left a voicemail for Jennifer Clark on 10/17/2022 at 12:30 PM  to schedule a Follow-up appointment with [facility] pharmacist. This is the first call attempt.When patient returns telephone call, please schedule as follows: Appt Type: Phone Consult or MyChart Video visitPharmacist: Linwood Dibbles reason/Notes:Medication name: GilenyaFollow-up AppointmentPertinent notes from referral: 6 MO F/U   -  schedule patient end of January beginning of feb 2025Order Priority: Routine Duration of Appt: 30 minutesMedication history has NOT been Health visitor, CPHTMedication Management Clinic [facility] Main Phone: [phone]

## 2022-10-20 ENCOUNTER — Encounter: Admit: 2022-10-20 | Payer: PRIVATE HEALTH INSURANCE | Attending: Pharmacist | Primary: Internal Medicine

## 2022-10-25 NOTE — Telephone Encounter
PHARMACY NOTE: APPOINTMENTI have talked to Serita Sheller on 10/25/2022 at 9:53 AM to make an appointment with [facility] Pharmacist. I have scheduled the patient:Appt Type: Phone ConsultPharmacist: Linwood Dibbles reason/Notes:Medication name: GilenyaFollow-up AppointmentPertinent notes from referral: 6 mo F/UOrder Priority: RoutineDate/Time of Appt: 1/30 @ 1:30PDuration of Appt: 30 minutesMedication history has NOT been completedAshley Kiandra Sanguinetti, CPHTMedication Management Clinic [facility] Main Phone: [phone]

## 2022-10-27 ENCOUNTER — Encounter: Admit: 2022-10-27 | Payer: PRIVATE HEALTH INSURANCE | Attending: Pharmacist | Primary: Internal Medicine

## 2022-10-27 LAB — STRATIFY JCV AB (W/INDEX) W/REFL TO INHIBITION ASSAY (BH GH LMW Q YH)
JCV ANTIBODY: POSITIVE — AB
JCV INDEX: 2.7 — ABNORMAL HIGH

## 2022-10-27 NOTE — Progress Notes
Monitoring:Patient is up to date with recommended specialty medication monitoring. CBC shows low lymphocyte count. JCV positive, so will continue to monitor closely - JCV titer stable at this time. Reviewed labs with Dr. Noe Gens, who is okay with continuing every 6 month monitoring, unless a clinical change.Fingolimod (Gilenya)Monitoring Parameter Recommended Frequency Last Obtained Next Due CBC w/ differential  Baseline, 1, 3, and 6 months after initiation of therapy, then every 6 months 8/15/24ALC 240 from 351 04/2023 LFTs Baseline, 1, 3, and 6 months after initiation of therapy, then every 6 months 8/15/24ALT 34 04/2023 ECG  Baseline   N/A Ophthalmologic exams Baseline, and 3-4 months after initiation, then annually Patient has annual eye exams at [facility] (Dr. Margaretha Glassing)   VZV titer Baseline, if no history of chickenpox or VZV vaccination Never done N/A  04/24/22 00:00 10/19/22 15:31 JCV Index 2.82 (H) 2.70 (H) (H): Data is abnormally high

## 2022-11-13 MED FILL — FINGOLIMOD 0.5 MG CAPSULE: 0.5 mg | ORAL | 28 days supply | Qty: 12 | Fill #1 | Status: CP

## 2022-11-24 ENCOUNTER — Encounter
Admit: 2022-11-24 | Payer: PRIVATE HEALTH INSURANCE | Attending: Student in an Organized Health Care Education/Training Program | Primary: Internal Medicine

## 2022-12-08 MED FILL — FINGOLIMOD 0.5 MG CAPSULE: 0.5 mg | ORAL | 28 days supply | Qty: 12 | Fill #2 | Status: CP

## 2022-12-12 ENCOUNTER — Ambulatory Visit
Admit: 2022-12-12 | Payer: PRIVATE HEALTH INSURANCE | Attending: Student in an Organized Health Care Education/Training Program | Primary: Internal Medicine

## 2023-01-01 MED FILL — FINGOLIMOD 0.5 MG CAPSULE: 0.5 mg | ORAL | 28 days supply | Qty: 12 | Fill #3 | Status: CP

## 2023-01-30 MED FILL — FINGOLIMOD 0.5 MG CAPSULE: 0.5 mg | ORAL | 28 days supply | Qty: 12 | Fill #4 | Status: CP

## 2023-02-08 ENCOUNTER — Encounter
Admit: 2023-02-08 | Payer: PRIVATE HEALTH INSURANCE | Attending: Student in an Organized Health Care Education/Training Program | Primary: Internal Medicine

## 2023-02-20 MED FILL — FINGOLIMOD 0.5 MG CAPSULE: 0.5 mg | ORAL | 28 days supply | Qty: 12 | Fill #5 | Status: CP

## 2023-02-22 IMAGING — MR MRI RIGHT SHOULDER WITHOUT CONTRAST
5 of 6 series · 27 of 40 positions shown · IV contrast (gadolinium)
Comparison: 02/13/2023 [facility] radiographs

________________________________________________________________________________________________ 
MRI RIGHT SHOULDER WITHOUT CONTRAST, 02/22/2023 [DATE]: 
CLINICAL INDICATION: Complete rotator cuff tear or rupture of right shoulder, 
not specified as traumatic.
TECHNIQUE: Multiplanar, multiecho position MR images of the shoulder were 
performed without intravenous gadolinium enhancement. Patient was scanned on a 
1.5T magnet.

[Series 201: survey right · axial · right · 10.0mm · 0.71mm/px · z∈[-40,+125]mm · 5 of 15 slices shown]
[im 1/15]
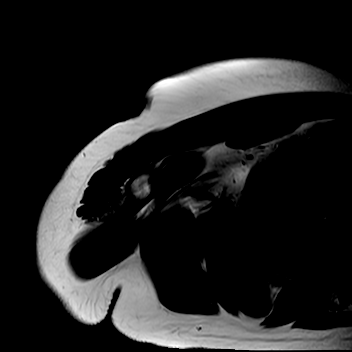
[im 4/15]
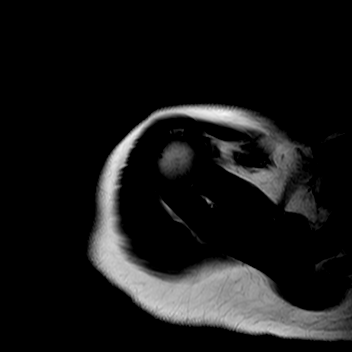
[im 8/15]
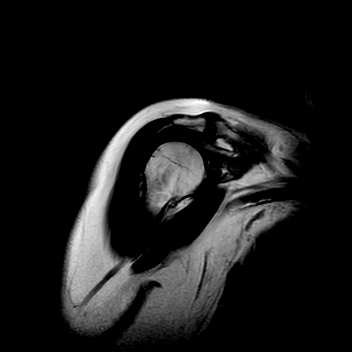
[im 11/15]
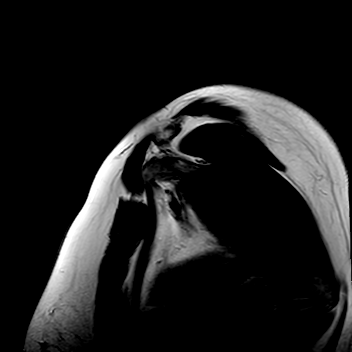
[im 15/15]
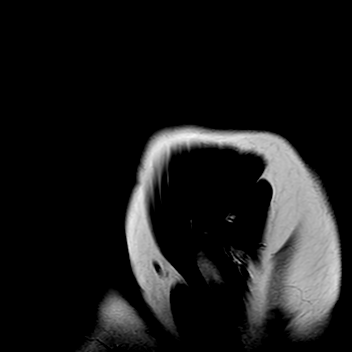

[Series 301: (person_name)_(person_name)_(person_name) right · axial · right · 3.5mm · 0.42mm/px · z∈[-35,+52]mm · 7 of 28 slices shown]
[im 1/28]
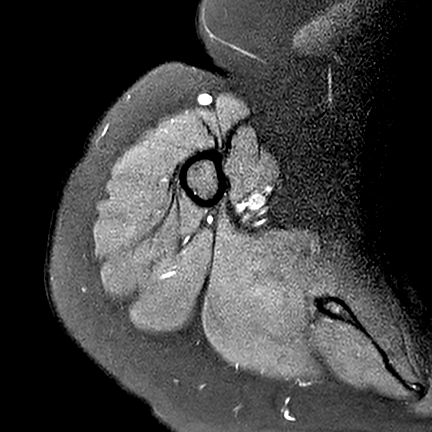
[im 5/28]
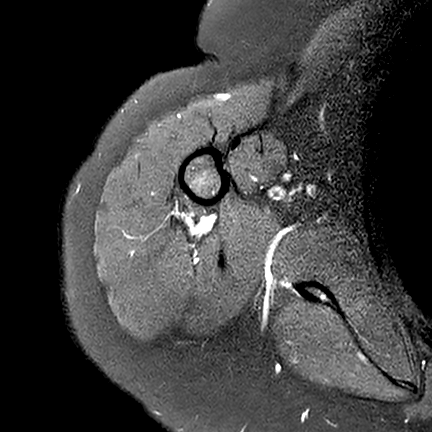
[im 10/28]
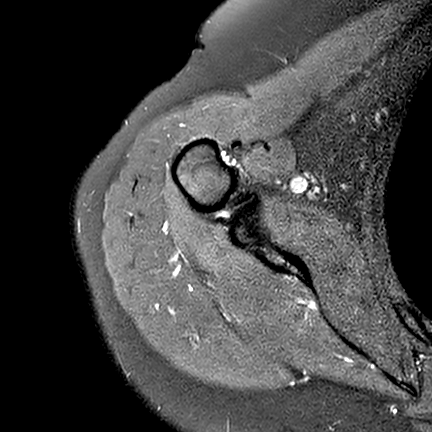
[im 14/28]
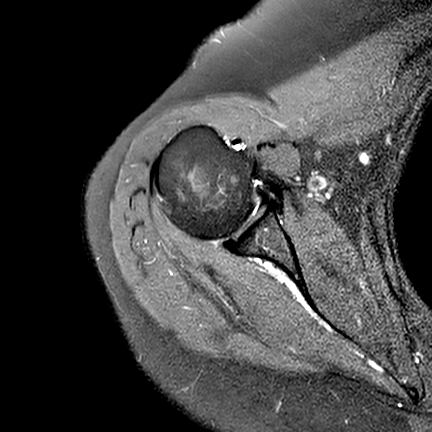
[im 19/28]
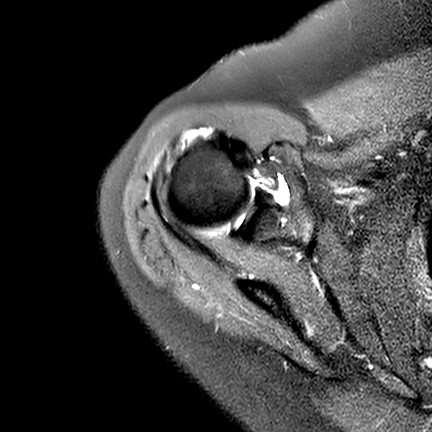
[im 23/28]
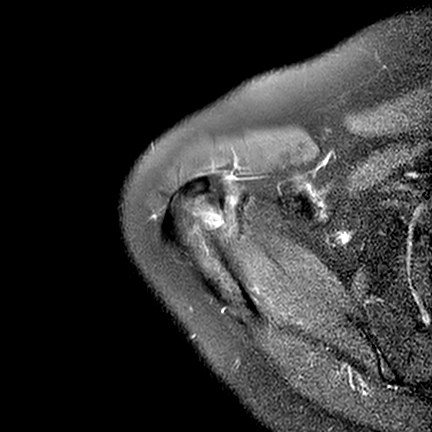
[im 28/28]
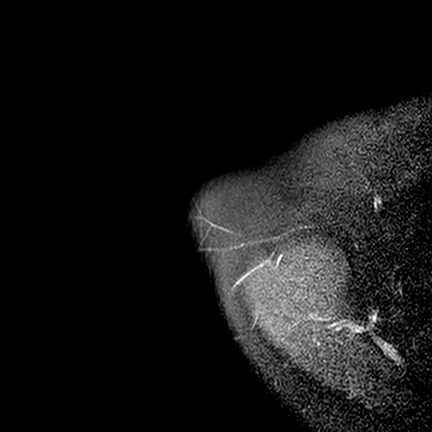

[Series 401: t2_fs_sag right · oblique · right · 3.5mm · 0.37mm/px · 8 of 30 slices shown]
[im 1/30]
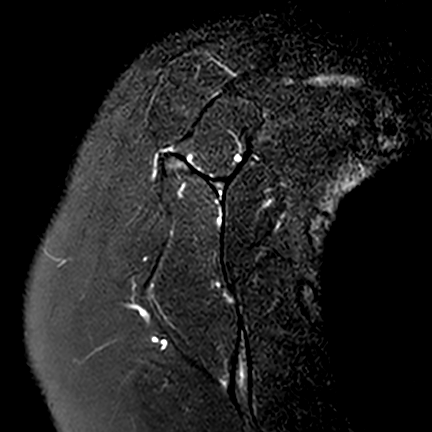
[im 5/30]
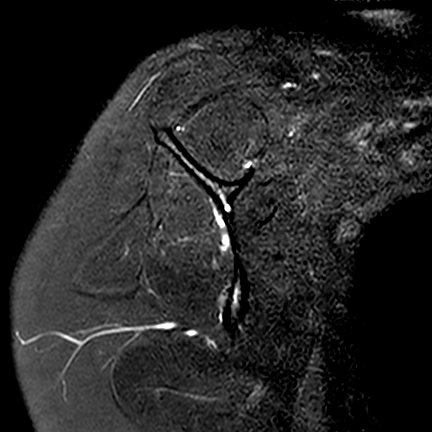
[im 9/30]
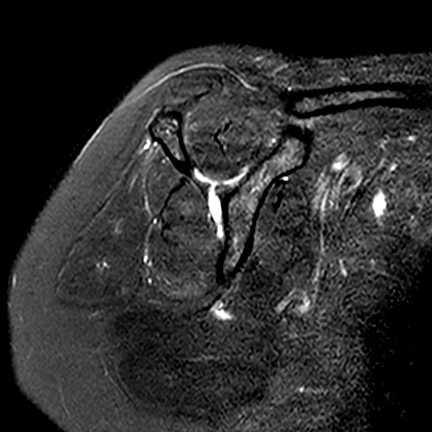
[im 13/30]
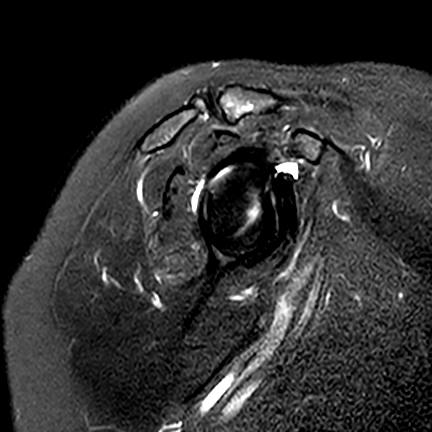
[im 17/30]
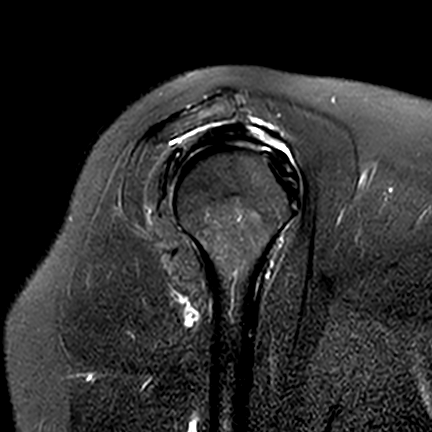
[im 21/30]
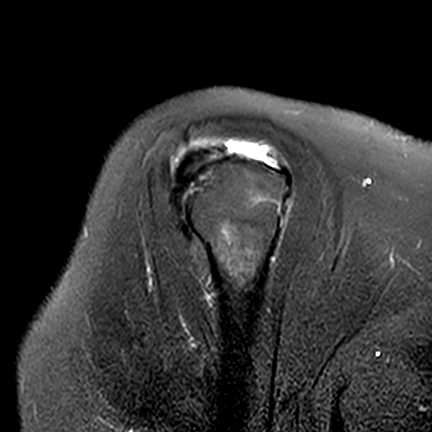
[im 25/30]
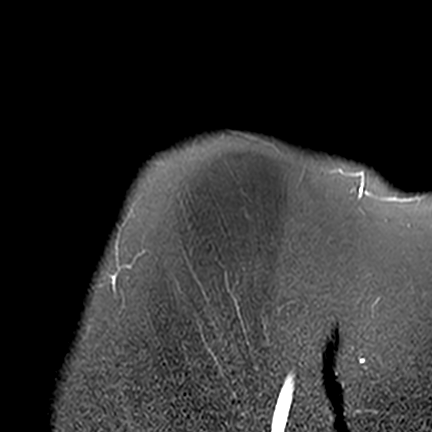
[im 30/30]
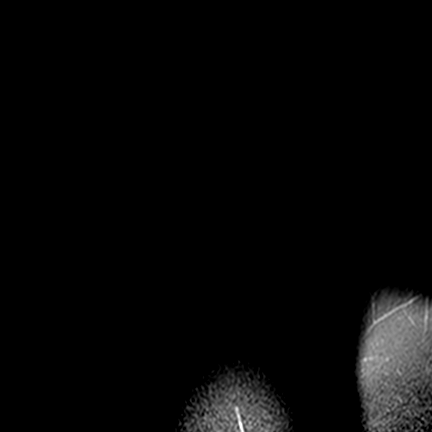

[Series 501: pd_fs_cor right · oblique · right · 3.5mm · 0.40mm/px · 6 of 24 slices shown]
[im 1/24]
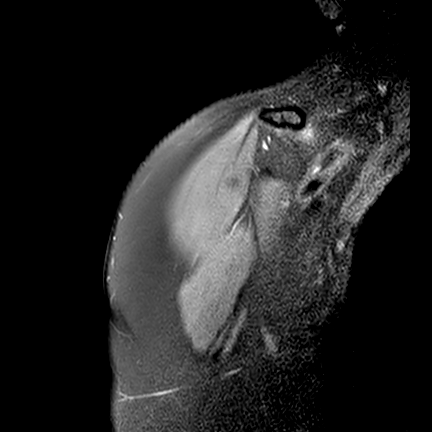
[im 5/24]
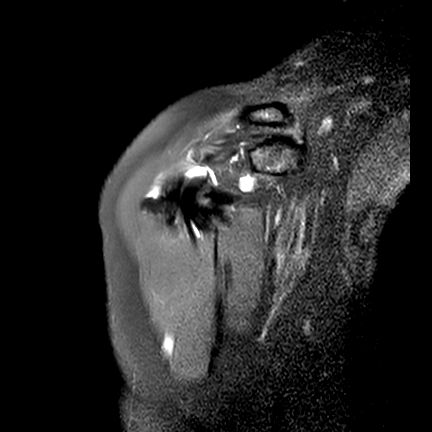
[im 10/24]
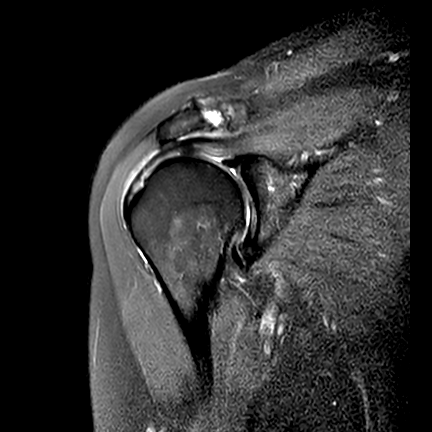
[im 14/24]
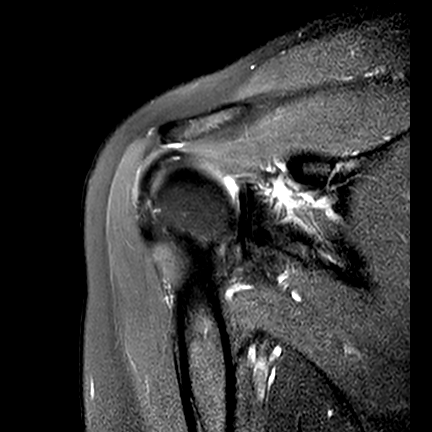
[im 19/24]
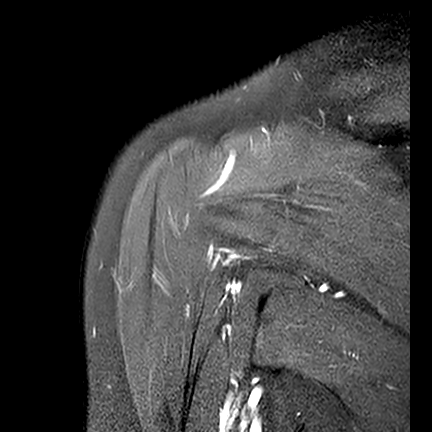
[im 24/24]
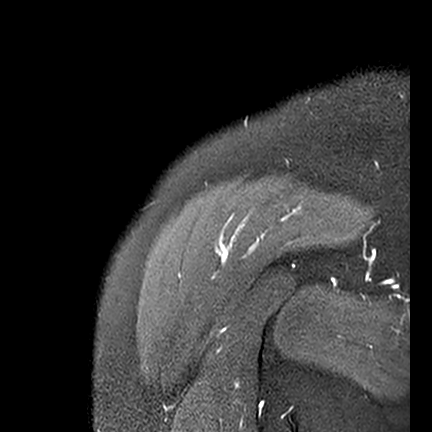

[Series 601: t1_sag right · oblique · right · 3.5mm · 0.31mm/px · 1 of 30 slices shown]
[im 1/30]
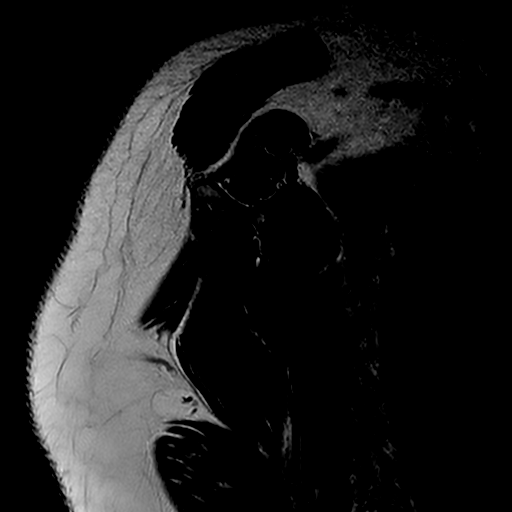

[27 of 40 positions shown; findings below may reference images not displayed]

FINDINGS: ROTATOR CUFF: Full-thickness tear of the distal supraspinatus tendon measures 
1.8 cm in AP dimensions with 1.4 cm medial retraction of the tendinous remnant. 
The infraspinatus, subscapularis and teres minor tendons are intact. Mild teres 
minor, infraspinatus and supraspinatus fatty muscular atrophy. 
ACROMIOCLAVICULAR JOINT: Moderate degenerative change of the acromioclavicular 
joint without mass effect upon the torn supraspinatus. The coracoacromial 
ligament is intact without prominent spurring at the acromial attachment. The 
acromioclavicular and coracoclavicular ligaments are preserved. The acromium is 
normal in morphology. 
GLENOHUMERAL JOINT: The humeral head is well located within the glenoid fossa. 
Articular cartilage is preserved.  The glenoid labrum is preserved. No 
paralabral cyst. The intra-articular portion of the long head of the biceps 
tendon is negative. Small joint effusion with extension into the 
subacromial/subdeltoid bursa. 
BONES: The bone marrow signal intensity is negative for fracture. No Hill-Sachs 
defect. Subcortical cystic change of the humeral head. 
ADDITIONAL FINDINGS: The axillary region is negative. Subcutaneous tissues are 
negative.
IMPRESSION: 1.  1.8 cm full-thickness distal supraspinatus tendon tear.  
2.  Mild teres minor, infraspinatus and supraspinatus fatty muscular atrophy. 
3.  Moderate AC joint degenerative change without mass effect upon the torn 
supraspinatus. 
4.  Small glenohumeral joint effusion.

## 2023-03-14 ENCOUNTER — Encounter
Admit: 2023-03-14 | Payer: PRIVATE HEALTH INSURANCE | Attending: Student in an Organized Health Care Education/Training Program | Primary: Internal Medicine

## 2023-03-14 DIAGNOSIS — G35 Multiple sclerosis: Secondary | ICD-10-CM

## 2023-03-14 MED ORDER — FINGOLIMOD 0.5 MG CAPSULE
0.5 | ORAL_CAPSULE | ORAL | 1 refills | 28.00 days | Status: AC
Start: 2023-03-14 — End: ?
  Filled 2023-03-17: qty 12, 28d supply, fill #0

## 2023-04-04 ENCOUNTER — Encounter: Admit: 2023-04-04 | Payer: PRIVATE HEALTH INSURANCE | Attending: Pharmacist | Primary: Internal Medicine

## 2023-04-05 ENCOUNTER — Encounter: Admit: 2023-04-05 | Payer: PRIVATE HEALTH INSURANCE | Attending: Pharmacist | Primary: Internal Medicine

## 2023-04-05 ENCOUNTER — Ambulatory Visit: Admit: 2023-04-05 | Payer: PRIVATE HEALTH INSURANCE | Attending: Pharmacist | Primary: Internal Medicine

## 2023-04-06 ENCOUNTER — Telehealth
Admit: 2023-04-06 | Payer: PRIVATE HEALTH INSURANCE | Attending: Pharmacist Clinician (PhC)/ Clinical Pharmacy Specialist | Primary: Internal Medicine

## 2023-04-06 NOTE — Telephone Encounter
 PHARMACY NOTE: APPOINTMENTI have talked to Serita Sheller on 04/06/2023 at 10:37 AM to make an appointment with our [facility] Pharmacist.I have scheduled the patient:Appt Type: Phone ConsultPharmacist: Linwood Dibbles reason/Notes:Medication name: GilenyaFollow-up AppointmentPertinent notes from referral: 6 mo F/UOrder Priority: RoutineDate/Time of Appt: 2/5 @ 9:30ADuration of Appt: 30 minutesMedication history has NOT been Health visitor, CPHTComprehensive Care Clinic [place] Phone: [phone]

## 2023-04-09 ENCOUNTER — Encounter
Admit: 2023-04-09 | Payer: PRIVATE HEALTH INSURANCE | Attending: Student in an Organized Health Care Education/Training Program | Primary: Internal Medicine

## 2023-04-09 DIAGNOSIS — G35 Multiple sclerosis: Secondary | ICD-10-CM

## 2023-04-09 MED ORDER — FINGOLIMOD 0.5 MG CAPSULE
0.5 | ORAL_CAPSULE | ORAL | 1 refills | 28.00 days | Status: AC
Start: 2023-04-09 — End: 2023-04-11

## 2023-04-09 NOTE — Telephone Encounter
 CCC:Has upcoming clinical pharmacy appointment. Will send limited refill supply in order to avoid gap in DMT. Further refills will be assessed at upcoming visit.Last visit with Dr. Gwyneth Sprout patient was approved to continue three time weekly dosing regimen.Bubba Hales, PharmD

## 2023-04-11 ENCOUNTER — Ambulatory Visit: Admit: 2023-04-11 | Payer: PRIVATE HEALTH INSURANCE | Attending: Pharmacist | Primary: Internal Medicine

## 2023-04-11 ENCOUNTER — Telehealth: Admit: 2023-04-11 | Payer: PRIVATE HEALTH INSURANCE | Primary: Internal Medicine

## 2023-04-11 DIAGNOSIS — G35 Multiple sclerosis: Secondary | ICD-10-CM

## 2023-04-11 MED ORDER — FINGOLIMOD 0.5 MG CAPSULE
0.5 | ORAL_CAPSULE | ORAL | 5 refills | 28.00 days | Status: AC
Start: 2023-04-11 — End: ?
  Filled 2023-04-13: qty 12, 28d supply, fill #0

## 2023-04-11 NOTE — Telephone Encounter
 I was present for the entirety of this encounter and agree with the assessment and plan as documented by the pharmacy resident. I was immediately available for any additional patient questions.Adonis Brook, PharmD, BCACPAmbulatory Care Clinical Pharmacist II, [facility]rologyY[facility]2/07/2023 2:34 PMCompleted an abbreviated telephone encounter for today's [facility] pharmacist visit as patient is currently in [place]. Patient expressed she did not want to reschedule. She wanted to speak to someone regarding MS care as there is anticipated change in her neurology provider.PHARMACY COMPREHENSIVE MEDICATION MANAGEMENT EVALUATION Encounter Date: 2/5/2025Referring/Attending Physician: Dr. Montez Morita is referred for collaborative medication management of Neurology Subjective:  Jennifer Clark is a 63 y.o. female who is seen by the clinic pharmacist for a(n) telephone visit. The purpose of today's visit is for the management of fingolimod (Gilenya). Neurology ManagementThe patient has a diagnosis of multiple sclerosis and is referred to the pharmacist for collaborative management. 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 Interim history08/14/24 Sport Medicine visit: Platelet Rich Plasma injection for degenerative tear of medial meniscus of L knee09/03/24 Sports Medicine visit: MD Dorothey Baseman recommended surgical treatment 12/12/22: L knee surgery Today's visitPatient was extremely frustrated regarding transition of care as her previous neurologist, Dr. Gwyneth Sprout left the clinic. She reports she has not received any communication regarding who will be taking over her care for MS which is concerning to her.Patient was open to completing a quick visit today with [facility] pharmacist to follow-up on fingolimod (Gilenya) and MS symptomsShe quickly reported no new or worsening MS symptoms    05/01/2018   2:26 PM 04/21/2021   2:28 PM MS Disability Metrics  Overall EDSS Score 1 2     Current Neurology Regimen(s): Therapy Start Date of Therapy Medication History/Notes Fingolimod (Gilenya) 0.5mg  by mouth three times weekly 02/2014 In 2020 she started taking Gilenya 3 times a week due to persistent lymphopenia  Does the patient feel the treatment regimen is effective? Yes CDC HRQOL-4 Score: Unhealthy days = 0 (10/04/2022 10:36 AM) CDC HRQOL-4 Score: Healthy days = 30 (10/04/2022 10:36 AM) Unable to complete CDC HRQOL or MS questionnaire due to time constraints    Previous Neurology Regimen(s): Therapy Date(s) of Therapy Medication History/Notes Interferon (Avonex) 2004 - 2015 Flu-like symptoms, injection fatigue, relapse  Medication ManagementMedication-related side effects: No reported side effects. 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 Adherence:The patient reports 0 missed and 0 late doses of medications in the past 4 week(s).  Medication storage:The patient reports storing medications in the bathroom cabinet. Affordability:The patient denies concerns with affordability of medications. Patient has a $10 copay. Preferred/Specialty pharmacy:Patient reports filling all specialty [facility] at [facility].I have reviewed the information collected by pharmacy technicians for all Refill Coordination Outreach Assessments since the last pharmacist Clinical Assessment Follow-Up. Medication history:A medication history was not obtained from the patient due to time constraints.Allergies, problem list, medications, and immunizations were reviewed this visit. Objective:  The following labs were reviewed during today's visit: CBC w/diff, CMP, and JCV titer Assessment/Plan:  Neurology Medication Therapy Assessment:Jennifer Clark feels the same on the current neurology medication regimen and is taking their regimen as prescribed. Patient reports no new or worsening symptoms of MS at this time. She continues to tolerate fingolimod (Gilenya). Patient's lymphocyte count continues to be low as seen on 08/15/ 2024 labs. [facility] pharmacist, Pattricia Boss, discussed those labs with neurologist, Dr. Noe Gens, who recommends continuing therapy at this time  with continued monitoring every 6 months. Given efficacy and tolerability, will continue current regimen with close monitoring. Today's visit was abbreviated as patient was mostly concerned about lack of communication regarding her MS care as her previous provider Dr. Gwyneth Sprout left the clinic and she does not know who will be taking over. Offered to provide [facility] number for further guidance. Patient reports she already has clinic number and will contact them for further assistance on transition of care.  Goals Addressed       This Visit's Progress   RxSp Therapeutic Goal   On track   Multiple Sclerosis: Reduce or maintain frequency of exacerbationsMTPs must be opened for patients not making appropriate progress towards their established therapeutic goals.Patient's progress towards goal: Patient is clinically and radiographically stable with no new or worsening neurological symptoms on fingolimod (Gilenya)   Medication Therapy Recommendations Medication Therapy RecommendationsNo medication therapy recommendations to display Monitoring:Patient is not up to date with recommended specialty medication monitoring. Patient prefers [facility] 10/20/22 [facility] pharmacist lab monitoring documentation CBC shows low lymphocyte count. JCV positive, so will continue to monitor closely - JCV titer stable at this time. Reviewed labs with Dr. Noe Gens, who is okay with continuing every 6 month monitoring, unless a clinical change. Fingolimod (Gilenya)Monitoring Parameter Recommended Frequency Last Obtained Next Due CBC w/ differential  Baseline, 1, 3, and 6 months after initiation of therapy, then every 6 months 8/15/24ALC 240 from 351 04/2023 LFTs Baseline, 1, 3, and 6 months after initiation of therapy, then every 6 months 8/15/24ALT 34 from 45 04/2023 ECG  Baseline   N/A Ophthalmologic exams Baseline, and 3-4 months after initiation, then annually Patient has annual eye exams at [facility] (Dr. Margaretha Glassing)   VZV titer Baseline, if no history of chickenpox or VZV vaccination Never done N/A     04/24/22 00:00 10/19/22 15:31 JCV Antibody POSITIVE ! POSITIVE ! !: Data is abnormal Immunizations:The patient is not up to date on immunizations as recommended by the CDC. Recommend the following immunizations for the patient:COVID-19, Influenza, and Other: RSV. Patient declined recommended vaccinations. Encouraged patient to avoid sick contacts and to wash hands thoroughly and often. Patient expressed understanding.    Comprehensive Medication ManagementThe patient is taking medications as prescribed. Cost of medications is not a barrier. Patient is not experiencing medication related side effects. The patient is not storing medications correctly. Educated patient that humidity can affect the efficacy of medications. Patient reports I have been storing medications in the bathroom for many years and have had no problems and plans to continue storing them in the bathroom. Adherence education was not provided; recommended: none necessary. The patient does need refills of medications at this time and refills were sent. Drug-drug interactions communicated to the referring clinician: None Dose adjustment recommendations communicated to the referring clinician: No adjustments required at this time Plan:Continue fingolimod (Gilenya) 0.5mg  by mouth three times weeklyObtain labs as soon as possible. Will [facility] to [facility] per patient's request. She plans to complete them in [place] at the end of this month when she's back in [place]. Medications ordered: fingolimod (Gilenya) 0.5mg  capsules, #12, 4 refills Labs ordered: CBC with differential, CMP, JCV titer Pharmacist Follow-Up: in 6 month(s)Neuro f/u: not scheduledPatient Education:   The following education was provided at today's visit : Assessment of efficacy and safety of drug therapy and Adverse effect presentation, evaluation, and monitoring during today?s visit to the patient regarding medications and multiple sclerosis management: proper administration of medications, purpose of each medication,  proper storage of medications, side effects of medications and importance of adherence with the regimen(s)  Medication EducationProper Use/How To Administer:Medication Name: Fingolimod (Gilenya)Dose/Directions: 0.5mg  by mouth once daily --> patient is taking 3 times weekly due to low lymphocyte countImportance of lab monitoring prior to starting and while on medicationsOTC and prescription drug interactions: appropriate counseling was provided to the patient to contact the pharmacist or clinician if a new medication is started.Duration of Therapy:  Patient should take this medication indefinitelyCommon/Severe side effects, mitigation strategy, and safety precautions:Increased risk for infections (avoid sick contacts, stay up to date on vaccinations, wash hands thoroughly, avoid large cuts to the skin)High blood pressure (check your blood pressure regularly; record measurements for your provider to bring to appointments)Missed dose education:If you miss a dose of this drug, take it as soon as you think about it. Then go back to your normal time.Do not take 2 doses at the same time or extra doses.If you miss 4 doses, call your doctor.Patient Specific Contraindications: No contraindications were identified for this patient Disposal:Non-Hazardous Medications:Do not dispose in the toilet or sinkIf disposing via the trash:Keep medication in original container and cross out any patient identifying informationModify the medication to discourage consumption For pills/capsules: add small amount of water to partially dissolveFor liquids: add salt, flour, kitty litter, etc. to make pungentFor blister packs: wrap pack in multiple layers of duct tapeSeal and conceal - tape shut, place in non-transparent bag (do NOT conceal with food products)Discard in trash canDrug take back programs: [place] list of police departments with drop boxes in their waiting rooms:  [email or web address].asp?q=501922 Safe Handling:No specific considerations are applicable for this medicationStorage and Handling:Store at room temperature, in a cool dry place away from heat, moisture and sunlight.Infection Control:Appropriate counseling was done on keeping vaccinations up to date and avoiding any sick contacts while on an immunomodulatory medication.  Time Spent: 15 minutes, telephone consult PATIENT AND/OR CAREGIVER VERBALIZED UNDERSTANDING OF CARE PLAN AND PATIENT GOALS: yesPATIENT ADVISED TO CALL BACK WITH QUESTIONS, CONCERNS, OR CHANGE IN SYMPTOMS. Eliseo Gum, PharmDPGY2 Ambulatory Care Pharmacy Resident TELEPHONE VISIT: For this visit the clinician and patient were present via telephone (audio only).Patient counseled on available options for visit type; Patient elected telephone visit (audio only); Patient consent given for telephone (audio only) visit : YesState patient is located in: [place]The clinician is not appropriately licensed in the above state to provide care for this visit therefore a telephone encounter.Patient Identity was confirmed during this call.  Other individuals actively participating in the telephone encounter and their name/relation to the patient: noneTotal time spent in medical telephone (audio only) discussion: 15 minutesBecause this visit was completed over telephone, a hands-on physical exam was not performed.  Patient understands and knows to call back if condition changes.

## 2023-05-08 MED FILL — FINGOLIMOD 0.5 MG CAPSULE: 0.5 mg | ORAL | 28 days supply | Qty: 12 | Fill #1 | Status: CP

## 2023-05-10 ENCOUNTER — Encounter: Admit: 2023-05-10 | Payer: PRIVATE HEALTH INSURANCE | Attending: Pharmacist | Primary: Internal Medicine

## 2023-05-10 DIAGNOSIS — G35 Multiple sclerosis: Secondary | ICD-10-CM

## 2023-05-10 MED ORDER — FINGOLIMOD 0.5 MG CAPSULE
0.5 | ORAL_CAPSULE | ORAL | 2 refills | 28.00 days | Status: AC
Start: 2023-05-10 — End: ?
  Filled 2023-06-02: qty 12, 28d supply, fill #0

## 2023-06-29 MED FILL — FINGOLIMOD 0.5 MG CAPSULE: 0.5 mg | ORAL | 28 days supply | Qty: 12 | Fill #1 | Status: CP

## 2023-07-31 MED FILL — FINGOLIMOD 0.5 MG CAPSULE: 0.5 mg | ORAL | 28 days supply | Qty: 12 | Fill #2 | Status: CP

## 2023-09-03 MED FILL — FINGOLIMOD 0.5 MG CAPSULE: 0.5 mg | ORAL | 28 days supply | Qty: 12 | Fill #3 | Status: CP

## 2023-09-04 ENCOUNTER — Encounter: Admit: 2023-09-04 | Payer: PRIVATE HEALTH INSURANCE | Attending: Pharmacist | Primary: Internal Medicine

## 2023-09-04 ENCOUNTER — Telehealth
Admit: 2023-09-04 | Payer: PRIVATE HEALTH INSURANCE | Attending: Student in an Organized Health Care Education/Training Program | Primary: Internal Medicine

## 2023-09-04 NOTE — Telephone Encounter
 Copied from CRM #[id number]. Topic: Scheduling - Schedule Appointment>> Sep 04, 2023  4:24 PM Lela SAUNDERS wrote:Placing outbound call to [facility], Ellyce to schedule an appointment per IB request Please reach out to this patient to get her scheduled with Dr. Joesph in [place]. AnnieSpoke with patient, she is scheduled to see Dr.Morgan on 12/17/23. Former patient of Dr.Epstein. Patient stated she went to [facility] yesterday for blood work and would like to know her results . Please advise Eni:[id number]

## 2023-09-05 LAB — COMPREHENSIVE METABOLIC PANEL
ALBUMIN/GLOBULIN RATIO: 1.8 (calc) (ref 1.0–2.5)
ALBUMIN: 4.5 g/dL (ref 3.6–5.1)
ALKALINE PHOSPHATASE: 51 U/L (ref 37–153)
ALT (SGPT): 39 U/L — ABNORMAL HIGH (ref 6–29)
AST (SGOT): 28 U/L (ref 10–35)
BILIRUBIN, TOTAL: 0.4 mg/dL (ref 0.2–1.2)
BLOOD UREA NITROGEN: 14 mg/dL (ref 7–25)
CALCIUM: 9.5 mg/dL (ref 8.6–10.4)
CHLORIDE: 105 mmol/L (ref 98–110)
CO2: 28 mmol/L (ref 20–32)
CREATININE: 0.77 mg/dL (ref 0.50–1.05)
EGFR, CREATININE (CKD-EPI 2021) QUEST: 86 mL/min/1.73m2 (ref >=60)
GLOBULIN: 2.5 g/dL (ref 1.9–3.7)
GLUCOSE: 90 mg/dL (ref 65–99)
POTASSIUM: 4 mmol/L (ref 3.5–5.3)
PROTEIN, TOTAL, SPEP: 7 g/dL (ref 6.1–8.1)
SODIUM: 141 mmol/L (ref 135–146)

## 2023-09-05 LAB — CBC AND DIFFERENTIAL
BASOPHILS ABSOLUTE COUNT: 9 {cells}/uL (ref 0–200)
BASOPHILS: 0.3 %
EOSINOPHILS ABSOLUTE COUNT: 78 {cells}/uL (ref 15–500)
EOSINOPHILS: 2.5 %
HEMATOCRIT BLOOD: 44.7 % (ref 35.0–45.0)
HEMOGLOBIN: 14.4 g/dL (ref 11.7–15.5)
LYMPHOCYTES ABSOLUTE COUNT: 298 {cells}/uL — ABNORMAL LOW (ref 850–3900)
LYMPHOCYTES: 9.6 %
MCH: 26.8 pg — ABNORMAL LOW (ref 27.0–33.0)
MCHC-HEMOGLOBINOPATHY: 32.2 g/dL (ref 32.0–36.0)
MCV: 83.2 fL (ref 80.0–100.0)
MONOCYTES ABSOLUTE COUNT: 267 {cells}/uL (ref 200–950)
MONOCYTES: 8.6 %
MPV: 11.7 fL (ref 7.5–12.5)
NEUTROPHILS ABSOLUTE COUNT: 2449 {cells}/uL (ref 1500–7800)
NEUTROPHILS: 79 %
PLATELET COUNT: 205 Thousand/uL (ref 140–400)
RDW: 13.9 % (ref 11.0–15.0)
RED BLOOD CELL COUNT: 5.37 Million/uL — ABNORMAL HIGH (ref 3.80–5.10)
WHITE BLOOD CELL COUNT: 3.1 Thousand/uL — ABNORMAL LOW (ref 3.8–10.8)

## 2023-09-05 LAB — STRATIFY JCV AB (W/INDEX) W/REFL TO INHIBITION ASSAY (BH GH LMW Q YH)
JCV ANTIBODY: POSITIVE — AB
JCV INDEX: 2.52 {index} — ABNORMAL HIGH

## 2023-09-05 NOTE — Telephone Encounter
 Spoke to patient who stated she had labs (ordered in February) drawn yesterday at [facility], unable to get more information as patient told me I would have to investigate on my own as she did not have time to speak to me.Called [facility] lab who state that do not have any lab that were drawn recently.

## 2023-09-06 NOTE — Progress Notes
 Monitoring:Patient is up to date with recommended specialty medication monitoring. Patient prefers [facility] labs.Lymphocyte counts and JCV titer are stable. Fingolimod  (Gilenya )Monitoring Parameter Recommended Frequency Last Obtained Next Due CBC w/ differential  Baseline, 1, 3, and 6 months after initiation of therapy, then every 6 months 6/30/25WBC 3.1ALC 298 from 240 02/2024 LFTs Baseline, 1, 3, and 6 months after initiation of therapy, then every 6 months 6/30/25ALT 39 from 34 02/2024 ECG  Baseline   N/A Ophthalmologic exams Baseline, and 3-4 months after initiation, then annually Patient has annual eye exams at [facility] (Dr. Emeline)   VZV titer Baseline, if no history of chickenpox or VZV vaccination Never done N/A   Latest Reference Range & Units 04/24/22 00:00 10/19/22 15:31 09/03/23 07:46 JCV Antibody  POSITIVE ! POSITIVE ! POSITIVE ! JCV Index index 2.82 (H) 2.70 (H) 2.52 (H) !: Data is abnormal(H): Data is abnormally Francois Sharps, PharmD, BCPS, MSCSAmbulatory Care Clinical Pharmacy Specialist, Neurology [facility]

## 2023-09-27 MED FILL — FINGOLIMOD 0.5 MG CAPSULE: 0.5 mg | ORAL | 28 days supply | Qty: 12 | Fill #4 | Status: CP

## 2023-10-09 ENCOUNTER — Ambulatory Visit: Admit: 2023-10-09 | Payer: PRIVATE HEALTH INSURANCE | Attending: Pharmacist | Primary: Internal Medicine

## 2023-10-11 ENCOUNTER — Ambulatory Visit: Admit: 2023-10-11 | Payer: PRIVATE HEALTH INSURANCE | Attending: Pharmacist | Primary: Internal Medicine

## 2023-10-11 ENCOUNTER — Encounter: Admit: 2023-10-11 | Payer: PRIVATE HEALTH INSURANCE | Attending: Pharmacist | Primary: Internal Medicine

## 2023-10-11 DIAGNOSIS — G35 Multiple sclerosis: Principal | ICD-10-CM

## 2023-10-11 DIAGNOSIS — I10 Essential (primary) hypertension: Secondary | ICD-10-CM

## 2023-10-11 MED ORDER — FINGOLIMOD 0.5 MG CAPSULE
0.5 | ORAL_CAPSULE | ORAL | 2 refills | 28.00000 days | Status: AC
Start: 2023-10-11 — End: ?
  Filled 2023-10-22: qty 12, 28d supply, fill #0

## 2023-10-11 NOTE — Progress Notes
 PHARMACY COMPREHENSIVE MEDICATION MANAGEMENT EVALUATIONEncounter Date: 8/7/2025Referring/Attending Physician: Dr. Elbert, transitioning to Dr. Harmon is referred for collaborative medication management of NeurologySubjective: Jennifer Clark is a 63 y.o. female who is seen by the clinic pharmacist for a(n) telephone visit. 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 YesCDC HRQOL-4 Score: Unhealthy days = No data recordedCDC HRQOL-4 Score: Healthy days = No data recordedPrevious Neurology Regimen(s): Therapy Date(s) of Therapy Medication History/Notes Interferon (Avonex) 2004 - 2015 Flu-like symptoms, injection fatigue, relapse  Medication ManagementMedication-related side effects: No reported side effects. 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 Medication storage:The patient reports storing medications on countertop.Affordability:The patient denies concerns with affordability of medications. Patient has a $10 copay.Preferred/Specialty pharmacy:Patient reports filling all specialty medications at [facility].I have reviewed the information collected by pharmacy technicians for all Refill Coordination Outreach Assessments since the last pharmacist Clinical Assessment Follow-Up.Medication history:A medication history was obtained from the patient.Allergies, problem list, medications, and immunizations were reviewed this visit.Objective: The following labs were reviewed during today's visit: CBC w/diff and CMPAssessment/Plan: Neurology Medication Therapy Assessment:Jennifer Clark feels the same on the current neurology medication regimen and is taking their regimen as prescribed. Patient has been stable on fingolimod  (Gilenya ) for 9 years. For the last years, patient reports she has been taking fingolimod  (Gilenya ) 0.5mg  three times a week. The decision to decrease the dose was made by Dr. [name] in the setting of disease stability and low lymphocyte counts. Patient's lymphocyte count has remained low, but is stable. Plan on continuing current regimen for now. Patient is establishing care with Dr. Joesph in a few weeks. Goals Addressed  None Medication Therapy Recommendations Medication Therapy RecommendationsNo medication therapy recommendations to display Monitoring:Patient is up to date with recommended specialty medication monitoring. Patient prefers [facility] labs. Lymphocyte counts and JCV titer are stable. Fingolimod  (Gilenya )Monitoring Parameter Recommended Frequency Last Obtained Next Due CBC w/ differential  Baseline, 1, 3, and 6 months after initiation of therapy, then every 6 months 6/30/25WBC 3.1ALC 298 from 240 02/2024 LFTs Baseline, 1, 3, and 6 months after initiation of therapy, then every 6 months 6/30/25ALT 39 from 34 02/2024 ECG  Baseline   N/A Ophthalmologic exams Baseline, and 3-4 months after initiation, then annually Patient has annual eye exams at [facility] (Dr. Emeline)   VZV titer Baseline, if no history of chickenpox or VZV vaccination Never done N/A    Latest Reference Range & Units 04/24/22 00:00 10/19/22 15:31 09/03/23 07:46 JCV Antibody   POSITIVE ! POSITIVE ! POSITIVE ! JCV Index index 2.82 (H) 2.70 (H) 2.52 (H) !: Data is abnormal(H): Data is abnormally high Immunizations:The patient is up to date on immunizations as recommended by the CDC. Recommend the following  immunizations for the patient:Other: RSV Deferred discussion at this visitComprehensive Medication ManagementThe patient is taking medications as prescribed. Cost of medications is not a barrier. Patient is not experiencing medication related side effects. The patient is storing medications correctly. Adherence education was not provided; recommended: none necessary. The patient does need refills of medications at this time and refills were sent.Drug-drug interactions communicated to the referring clinician: NoneDose adjustment recommendations communicated to the referring clinician: No adjustments required at this timePlan:Continue fingolimod  (Gilenya ) 0.5mg  by mouth three times weeklyO[facility]ain [facility] [place] Dr. Joesph on 9/3/25Medications ordered: fingolimod  (Gilenya ) 0.5mg  capsules, #36, 1 refillLabs ordered: CBC and differential, CMPPharmacist Follow-Up: in 6 month(s), 04/08/24 10:00AMPatient Education:  The following education was provided prior to sending refills: Assessment of efficacy and safety of drug therapy and Adverse effect presentation, evaluation, and monitoring during today?s visit to the patient regarding medications and multiple sclerosis management: proper administration of medications, purpose of each medication, proper storage of medications, side effects of medications and importance of adherence with the regimen(s)Medication Education  Proper Use/How To Administer:Medication Name: Fingolimod  (Gilenya )Dose/Directions: 0.5mg  by mouth once daily --> patient is taking 3 times weekly due to low lymphocyte countImportance of lab monitoring prior to starting and while on medicationsOTC and prescription drug interactions: appropriate counseling was provided to the patient to contact the pharmacist or clinician if a new medication is started.Duration of Therapy:  Patient should take this medication indefinitelyCommon/Severe side effects, mitigation strategy, and safety precautions:Increased risk for infections (avoid sick contacts, stay up to date on vaccinations, wash hands thoroughly, avoid large cuts to the skin)High blood pressure (check your blood pressure regularly; record measurements for your provider to bring to appointments)Missed dose education:If you miss a dose of this drug, take it as soon as you think about it. Then go back to your normal time.Do not take 2 doses at the same time or extra doses.If you miss 4 doses, call your doctor.Patient Specific Contraindications: No contraindications were identified for this patient Disposal:Non-Hazardous Medications:Do not dispose in the toilet or sinkIf disposing via the trash:Keep medication in original container and cross out any patient identifying informationModify the medication to discourage consumption For pills/capsules: add small amount of water to partially dissolveFor liquids: add salt, flour, kitty litter, etc. to make pungentFor blister packs: wrap pack in multiple layers of duct tapeSeal and conceal - tape shut, place in non-transparent bag (do NOT conceal with food products)Discard in trash canDrug take back programs: [place] list of police departments with drop boxes in their waiting rooms:  TelephonePost.uy.asp?[email or web address] Safe Handling:No specific considerations are applicable for this medicationStorage and Handling:Store at room temperature, in a cool dry place away from heat, moisture and sunlight.Infection Control:Appropriate counseling was done on keeping vaccinations up to date and avoiding any sick contacts while on an immunomodulatory medication.  Time Spent: 15 minutes, telephone consultPATIENT AND/OR CAREGIVER VERBALIZED UNDERSTANDING OF CARE PLAN AND PATIENT GOALS: yesPATIENT ADVISED TO CALL BACK WITH QUESTIONS, CONCERNS, OR CHANGE IN SYMPTOMS.Jennifer Clark, PharmD, BCPSAmbulatory Care Clinical Pharmacist II, Neurology [facility] HealthTELEPHONE VISIT: For this visit the clinician and patient were present via telephone (audio only).Patient counseled on available options for visit type; Patient elected telephone visit (audio only); Patient consent given for telephone (audio only) visit : YesState patient is located in: CTThe clinician is appropriately licensed in the above state to provide care for this visitPatient Identity was confirmed during this call.  Other individuals actively participating in the telephone encounter and their name/relation to the patient: noneGreater  than 10 minutes of time was spent with this patient in medical discussion to support the telephone (audio only) visit: Yes The level of service was determined by medical decision making (MDM).Because this visit was completed over telephone, a hands-on physical exam was not performed.  Patient understands and knows to call back if condition changes.

## 2023-10-11 NOTE — Patient Instructions
 Plan:Continue fingolimod  (Gilenya ) 0.5mg  by mouth three times weeklyO[facility]ain labs [facility] [place] Dr. Joesph on 9/3/25Medications ordered: fingolimod  (Gilenya ) 0.5mg  capsules, #36, 1 refillLabs ordered: CBC and differential, CMPPharmacist Follow-Up: in 6 month(s), 04/08/24 10:00AM

## 2023-10-29 ENCOUNTER — Encounter: Admit: 2023-10-29 | Payer: PRIVATE HEALTH INSURANCE | Primary: Internal Medicine

## 2023-11-07 ENCOUNTER — Encounter
Admit: 2023-11-07 | Payer: PRIVATE HEALTH INSURANCE | Attending: Student in an Organized Health Care Education/Training Program | Primary: Internal Medicine

## 2023-11-07 ENCOUNTER — Ambulatory Visit
Admit: 2023-11-07 | Payer: PRIVATE HEALTH INSURANCE | Attending: Student in an Organized Health Care Education/Training Program | Primary: Internal Medicine

## 2023-11-07 VITALS — BP 122/78 | HR 78 | Temp 98.80000°F | Resp 20 | Wt 186.0 lb

## 2023-11-07 DIAGNOSIS — G35 Multiple sclerosis: Principal | ICD-10-CM

## 2023-11-07 DIAGNOSIS — G629 Polyneuropathy, unspecified: Secondary | ICD-10-CM

## 2023-11-07 DIAGNOSIS — Z79899 Other long term (current) drug therapy: Secondary | ICD-10-CM

## 2023-11-07 DIAGNOSIS — I10 Essential (primary) hypertension: Secondary | ICD-10-CM

## 2023-11-07 NOTE — Progress Notes
 [facility] Progress NoteReason for visit: Follow-up MS on DMTDiane Clark is a 63 y.o. 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 She previously followed with Dr. Elbert and was last seen in 04/2021, now transferring care to me. At that time, the plan was:-Labs now at [facility] brain next 10/2021; alternate brain and brain/spinal cord QOY-Refill Gilenya  today, continue at [facility] for now-Weight loss apps, continue with regular exercise-Continue to discuss DMT switch pending repeat labs and clinical course-Follow up in 6 monthsInterval History:Click to report MS disease activityDISEASE SUMMARYCurrent MS phenotype: RelapsingDisease activity since last visit?: NoDisease progression since last visit?: NoMost recent labs: CBC and CMP 07/2023 with ALC 377, Most recent surveillance MRI: MRI brain and C spine 12/2021 which showed stability. This imaging was personally reviewed today. MS history was extensively reviewed today, some portions may have been copied from prior notes and addended as appropriate. No new neurologic symptoms concerning for demyelinating attack. History of Present Jennifer Clark has a history of multiple sclerosis diagnosed in 2004, with last concern for possible flare approximately ten years ago but no new lesions or significant changes since then. She is currently on a reduced dose of Gilenya , taking it three times a week, and has not experienced any major episodes since reducing the dose. Although dose note concern for increased infections/when does have an infection it tends to be more severe for her. Her initial MS symptoms in 2004 included left-sided numbness, tingling, and weakness, described as a 'creepy, crawly feeling' like 'spiders' on her skin. She was initially treated with Avonex injections, which she discontinued due to side effects, and transitioned to Gilenya , which she tolerates well without side effects.She has a history of two subsequent concern for MS flares in 2010 and 2012, characterized by burning pain on her left side, which was managed with Solu-Medrol and prednisone. She previously thought she was allergic to prednisone due to a rash after taking medication but tolerated it well during a recent illness in 04/2023.She had COVID 01/2020 and again 03/2021. First time she had to get monoclonal Ab, but was out for about 8 weeks. She had been vaccinated.She experiences a recurring 'stabbing' sensation in her right big toe, which started about 6 years ago. This sensation occurs sporadically, often when she is overheated, stressed, or tired. Despite this, she remains very active, engaging in activities such as pickleball, walking, and swimming, and reports no concerns for decreased activity tolerance. She continues to work in [employer], [employer]. She has a history of meniscus surgery and reports doing well post-operatively without MS flare-ups. Does continue to have daytime fatigue, typically managed with a late afternoon nap as needed. She attributes some of this to MS, but also notes she typically wakes up at 430a and that her day is very active/busy.If on DMT:No complications to medication. Tolerating pills well. No concerns for missed doses. No falls, hospitalizations, infections outside of those listed above.Disease summary (portions copied forward from prior notes and amended as appropriate):Neuroimmunology Documentation Neurologist confirmed diagnosis: MS Year of Diagnosis: 2004 Relapse HistoryClinician-defined relapses in chronological order. Relapses are new/recurrent symptoms lasting at least 24 hours in the absence of infection/feverYear Month Date Certainty Symptoms Site Duration             Multiple Sclerosis - Disease-modifying TherapiesMedication Start Year Start month Stop Year Stop Month Top Stop Reason Top Stop Comment Interferon beta-1a intramuscularFingolimod [id number] 12 2015 12 intolerance/side effects/symptoms On for 11 years before switch  Ampyra was tried:  MRI ChangesYear  Month Type *New Lesions Physician *New Lesions Radiology Comment [phone] [id number] braincervical spinebrainbrainbraincervical spinebraincervical spinebraincervical spine  99999999 BaselineBaseline CSF Studies not performed:  CSF StudiesYear Oligoclonal Bands WBC RBC Missing Values Protein IgG Index Comments 2004       missing test results unknown     reportedly consistent with MS. Autoantibody HistoryAutoantibody testing not performed: YesYear Month Specimen type Positive Antibodies Negative Antibodies Comments             Pertinent labs:2025 JCV + (2.52)Review of Systems: A complete 14-point review of systems was completed by the patient and entered separately. Pertinent positives were reviewed by me. The patient has the following additional complaints: Self reported data Allergies:Jennifer Clark is allergic to prednisone.Past Medical History:Jennifer Clark  has a past medical history of Hypertension and Multiple sclerosis (HC Code). Past Surgical History:	Jennifer Clark  has a past surgical history that includes Appendectomy and Ectopic pregnancy surgery.Family History:Ms. Zellman's Family history is unknown by patient.  Social History:  She  reports that she has never smoked. She does not have any smokeless tobacco history on file. She reports that she does not drink alcohol and does not use drugs.Current medications: Current Outpatient Medications:   cholecalciferol  (vitamin D3), 5,000 Units, Oral, Daily  fingolimod , 0.5 mg, Oral, Three Times Weekly  hydroCHLOROthiazide, 12.5 mg, Oral, QAM  rosuvastatin , 10 mg, Oral, Daily  lisinopriL, 20 mg, Oral, NightlyPhysical examinationVitals:  11/07/23 1100 BP: 122/78 Pulse: 78 Resp: 20 Temp: 98.8 ?F (37.1 ?C) Weight: 84.4 kg    No data to display    General: Jennifer Clark is a  well-developed White female. She is pleasant and cooperative with the exam. She appears in no distress. Normocephalic, atraumatic, sclera an-icteric b/l. No rashes or pedal edema.Neurological ExamMental StatusAwake and alert. Speech is normal. Fund of knowledge is appropriate for level of education.Appropriate mood and affect.SABRACranial NervesCN II: Visual fields full to confrontation.CN III, IV, VI: Extraocular movements intact bilaterally. Normal lids and orbits bilaterally. Pupils equal round and reactive to light bilaterally.CN V: Facial sensation is normal.CN VII: Full and symmetric facial movement.CN VIII: Hearing is normal.CN IX, X: Palate elevates symmetricallyCN XI: Shoulder shrug strength is normal.CN XII: Tongue midline without atrophy or fasciculations.MotorNormal muscle bulk throughout. Normal muscle tone. No abnormal involuntary movements. Strength is 5/5 in all four extremities except as noted.SensoryLight touch is normal in upper and lower extremities. Decreased vibration L foot to ankle and diminished in R toes; intact BUE. Reflexes                                          Right                      LeftBrachioradialis                    2+                         2+Biceps                                 2+                         2+Triceps  2+                         2+Patellar                                2+                         2+Achilles                                2+                         2+Right pathological reflexes: Ankle clonus absent.Left pathological reflexes: Ankle clonus absent.CoordinationRight: Finger-to-nose normal. Rapid alternating movement normal. Heel-to-shin normal.Left: Finger-to-nose normal. Rapid alternating movement normal. Heel-to-shin normal.GaitCasual gait is normal including stance, stride, and arm swing. Able to take few steps before step out/being imbalanced with tandem. Romberg is absent.  05/01/2018   2:26 PM 04/21/2021   2:28 PM 11/07/2023   8:00 AM MS Disability Metrics  Overall EDSS Score 1 2  Time 25 ft walk trial #1   4.8 Time 25 ft walk trial #2   4.9 Assistive device   None No questionnaires on file.No questionnaires on file.No questionnaires on file.No questionnaires on file.No questionnaires on file. Laboratory StudiesNo visits with results within 6 Month(s) from this visit. Latest known visit with results is: Telephone on 04/11/2023 Component Date Value  WBC 09/03/2023 3.1 (L)   RBC 09/03/2023 5.37 (H)   Hemoglobin 09/03/2023 14.4   Hematocrit 09/03/2023 44.7   MCV 09/03/2023 83.2   MCH 09/03/2023 26.8 (L)   MCHC 09/03/2023 32.2   RDW 09/03/2023 13.9   Platelets 09/03/2023 205   MPV 09/03/2023 11.7   Neutrophils Absolute 09/03/2023 2,449   Lymphocytes Absolute 09/03/2023 298 (L)   Monocytes Absolute 09/03/2023 267   Eosinophils Absolute 09/03/2023 78   Basophils Absolute 09/03/2023 9   Neutrophils 09/03/2023 79   Lymphocytes 09/03/2023 9.6   Monocytes 09/03/2023 8.6   Eosinophils 09/03/2023 2.5   Basophils 09/03/2023 0.3   Glucose 09/03/2023 90   BUN 09/03/2023 14   Creatinine 09/03/2023 0.77   eGFR (Creatinine) 09/03/2023 86   BUN/Creatinine Ratio 09/03/2023 SEE NOTE:   Sodium 09/03/2023 141   Potassium 09/03/2023 4.0   Chloride 09/03/2023 105   CO2 09/03/2023 28   Calcium 09/03/2023 9.5   Protein, Total 09/03/2023 7.0   Albumin 09/03/2023 4.5   Globulin 09/03/2023 2.5   Albumin/Globulin Ratio 09/03/2023 1.8   Bilirubin, Total 09/03/2023 0.4   Alkaline Phosphatase 09/03/2023 51   AST 09/03/2023 28   ALT 09/03/2023 39 (H)   JCV Index 09/03/2023 2.52 (H)   JCV Antibody 09/03/2023 POSITIVE (A)  Impression:Paisly Coste is a 63 y.o. 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 I agree with her dx of MS based on review of clinical hx and ancillary testing today. She does have low burden of radiographic disease but some of these lesions are JC and in the spinal cord, plus has been told that spinal tap in past consistent with MS, which would lead her to meet 2017 McDonald dx criteria. She is clinically and radiologically stable at this time. Her preference is to stop DMT if possible, which is reasonable given her  age, long-standing clinical and radiographic disease stability, and fact that she is already on a reduced dose of Gilenya . The risks of severe/recurrent infections is also thought to outweigh the expected benefit of continued Gilenya  therapy at this time. Will plan to stop Gilenya  after today's appointment and continue clinical and radiologic monitoring in interim. I don't know if the R foot symptoms that she currently experiences intermittently can be attributed just to MS given MRI stability since symptom onset, which would make last MS relapse concern in about 2012. She does have other risk factors for neuropathy such as a slightly elevated A1C (6.1) and will complete neuropathy serum evaluation today for further evaluation.Plan:-DMT: Stop Gilenya -Surveillance lab work: Vit D, Vit B12, IFE, CBC in about 3 mos from DMT stop-Surveillance MRI imaging: MRI brain, C/T spine wo about 6 months from DMT stop, before next follow up-Additional meds:	Continue Vit D supplement and B12 supplement-General brain health/lifestyle recommendations: Encouraged healthy diet and physical activity -RTC in 8 months when back from [facility] office. Contact office earlier if new neurologic symptoms develop, old neurologic symptoms worsen, and/or questions/concerns arise.On the day of this patient's encounter, a total of 75 minutes was personally spent by me.  This does not include any resident/fellow teaching time, or any time spent performing a procedural service. Hezekiah Search, MDAssistant Professor of NeurologyMultiple Sclerosis [employer], [employer] I provided a concise overview of the ambient note generation solution. Macaila Happe or their legally authorized representative verbally consented to a temporary audio recording of their visit to assist with completing the visit documentation using an AI-powered solution. This note was reviewed for accuracy by Hezekiah Search who performed the clinical service.

## 2023-11-19 ENCOUNTER — Telehealth
Admit: 2023-11-19 | Payer: PRIVATE HEALTH INSURANCE | Attending: Student in an Organized Health Care Education/Training Program | Primary: Internal Medicine

## 2023-11-19 NOTE — Telephone Encounter
 Spoke with Radiology, Orders are correct.  I requested a call to be placed again to patient to have this scheduled.  Call will be sent as a request to scheduling to reach out to patient.

## 2023-11-19 NOTE — Telephone Encounter
 Copied from CRM #[id number]. Topic: [facility] General CRM - General Inquiry>> Nov 19, 2023 10:10 AM Aneya Daddona B wrote:Mckinstry, Tanicka called regarding having her MRI extended because the one that was placed expires before May 2026. She would like the orders to be corrected so she can schedule her appointment.

## 2023-11-28 ENCOUNTER — Encounter: Admit: 2023-11-28 | Payer: PRIVATE HEALTH INSURANCE | Attending: Pharmacist | Primary: Internal Medicine

## 2023-11-28 NOTE — Progress Notes
 Monitoring:Patient is up to date with recommended specialty medication monitoring. Patient prefers [facility] labs. Lymphocyte count has improved since the patient stopped fingolimod  (Gilenya ) earlier this month. Fingolimod  (Gilenya )Monitoring Parameter Recommended Frequency Last Obtained Next Due CBC w/ differential  Baseline, 1, 3, and 6 months after initiation of therapy, then every 6 months 9/23/25ALC 505 from 298 02/2024 LFTs Baseline, 1, 3, and 6 months after initiation of therapy, then every 6 months 6/30/25ALT 39 from 34 02/2024 ECG  Baseline   N/A Ophthalmologic exams Baseline, and 3-4 months after initiation, then annually Patient has annual eye exams at [facility] (Dr. Emeline)   VZV titer Baseline, if no history of chickenpox or VZV vaccination Never done N/A    Latest Reference Range & Units 04/24/22 00:00 10/19/22 15:31 09/03/23 07:46 JCV Antibody   POSITIVE ! POSITIVE ! POSITIVE ! JCV Index index 2.82 (H) 2.70 (H) 2.52 (H) !: Data is abnormal(H): Data is abnormally Francois Sharps, PharmD, BCPS, MSCSAmbulatory Care Clinical Pharmacy Specialist, Neurology [facility]

## 2023-11-29 LAB — CBC AND DIFFERENTIAL
BASOPHILS ABSOLUTE COUNT: 20 {cells}/uL (ref 0–200)
BASOPHILS: 0.4 %
EOSINOPHILS ABSOLUTE COUNT: 69 {cells}/uL (ref 15–500)
EOSINOPHILS: 1.4 %
HEMATOCRIT BLOOD: 43.3 % (ref 35.0–45.0)
HEMOGLOBIN: 13.8 g/dL (ref 11.7–15.5)
LYMPHOCYTES ABSOLUTE COUNT: 505 {cells}/uL — ABNORMAL LOW (ref 850–3900)
LYMPHOCYTES: 10.3 %
MCH: 26.5 pg — ABNORMAL LOW (ref 27.0–33.0)
MCHC-HEMOGLOBINOPATHY: 31.9 g/dL — ABNORMAL LOW (ref 32.0–36.0)
MCV: 83.3 fL (ref 80.0–100.0)
MONOCYTES ABSOLUTE COUNT: 402 {cells}/uL (ref 200–950)
MONOCYTES: 8.2 %
MPV: 11.7 fL (ref 7.5–12.5)
NEUTROPHILS ABSOLUTE COUNT: 3905 {cells}/uL (ref 1500–7800)
NEUTROPHILS: 79.7 %
PLATELET COUNT: 241 Thousand/uL (ref 140–400)
RDW: 14.1 % (ref 11.0–15.0)
RED BLOOD CELL COUNT: 5.2 Million/uL — ABNORMAL HIGH (ref 3.80–5.10)
WHITE BLOOD CELL COUNT: 4.9 Thousand/uL (ref 3.8–10.8)

## 2023-11-29 LAB — VITAMIN D, 25-HYDROXY: VITAMIN D, 25 OH, TOTAL: 78 ng/mL (ref 30–100)

## 2023-11-29 LAB — VITAMIN B12: VITAMIN B-12: 1101 pg/mL — ABNORMAL HIGH (ref 200–1100)

## 2023-11-29 LAB — IMMUNOFIXATION ELECTROPHORESIS, SERUM

## 2023-11-30 ENCOUNTER — Encounter
Admit: 2023-11-30 | Payer: PRIVATE HEALTH INSURANCE | Attending: Student in an Organized Health Care Education/Training Program | Primary: Internal Medicine

## 2023-12-04 ENCOUNTER — Encounter: Admit: 2023-12-04 | Payer: PRIVATE HEALTH INSURANCE | Attending: Family | Primary: Internal Medicine

## 2023-12-04 DIAGNOSIS — R200 Anesthesia of skin: Secondary | ICD-10-CM

## 2023-12-04 DIAGNOSIS — R252 Cramp and spasm: Secondary | ICD-10-CM

## 2023-12-04 DIAGNOSIS — R5382 Chronic fatigue, unspecified: Principal | ICD-10-CM

## 2023-12-13 ENCOUNTER — Encounter: Admit: 2023-12-13 | Payer: PRIVATE HEALTH INSURANCE | Primary: Internal Medicine

## 2023-12-17 ENCOUNTER — Encounter
Admit: 2023-12-17 | Payer: PRIVATE HEALTH INSURANCE | Attending: Student in an Organized Health Care Education/Training Program | Primary: Internal Medicine

## 2024-01-05 ENCOUNTER — Encounter: Admit: 2024-01-05 | Payer: PRIVATE HEALTH INSURANCE | Attending: Pharmacist | Primary: Internal Medicine

## 2024-01-05 DIAGNOSIS — G35D Multiple sclerosis: Principal | ICD-10-CM

## 2024-02-06 ENCOUNTER — Encounter: Admit: 2024-02-06 | Payer: PRIVATE HEALTH INSURANCE | Attending: Family | Primary: Internal Medicine

## 2024-02-06 DIAGNOSIS — R252 Cramp and spasm: Secondary | ICD-10-CM

## 2024-02-06 DIAGNOSIS — R5382 Chronic fatigue, unspecified: Principal | ICD-10-CM

## 2024-02-06 DIAGNOSIS — R200 Anesthesia of skin: Secondary | ICD-10-CM

## 2024-02-07 ENCOUNTER — Encounter
Admit: 2024-02-07 | Payer: PRIVATE HEALTH INSURANCE | Attending: Student in an Organized Health Care Education/Training Program | Primary: Internal Medicine

## 2024-02-08 ENCOUNTER — Encounter: Admit: 2024-02-08 | Payer: PRIVATE HEALTH INSURANCE | Attending: Pharmacist | Primary: Internal Medicine

## 2024-02-08 LAB — COMPREHENSIVE METABOLIC PANEL
ALBUMIN/GLOBULIN RATIO: 1.9 (calc) (ref 1.0–2.5)
ALBUMIN: 4.6 g/dL (ref 3.6–5.1)
ALKALINE PHOSPHATASE: 56 U/L (ref 37–153)
ALT (SGPT): 24 U/L (ref 6–29)
AST (SGOT): 24 U/L (ref 10–35)
BILIRUBIN, TOTAL: 0.5 mg/dL (ref 0.2–1.2)
BLOOD UREA NITROGEN: 16 mg/dL (ref 7–25)
CALCIUM: 10 mg/dL (ref 8.6–10.4)
CHLORIDE: 102 mmol/L (ref 98–110)
CO2: 29 mmol/L (ref 20–32)
CREATININE: 0.77 mg/dL (ref 0.50–1.05)
EGFR, CREATININE (CKD-EPI 2021) QUEST: 86 mL/min/1.73m2 (ref >=60)
GLOBULIN: 2.4 g/dL (ref 1.9–3.7)
GLUCOSE: 94 mg/dL (ref 65–139)
POTASSIUM: 3.8 mmol/L (ref 3.5–5.3)
PROTEIN, TOTAL, SPEP: 7 g/dL (ref 6.1–8.1)
SODIUM: 140 mmol/L (ref 135–146)

## 2024-02-08 LAB — CBC AND DIFFERENTIAL
BASOPHILS ABSOLUTE COUNT: 18 {cells}/uL (ref 0–200)
BASOPHILS: 0.3 %
EOSINOPHILS ABSOLUTE COUNT: 78 {cells}/uL (ref 15–500)
EOSINOPHILS: 1.3 %
HEMATOCRIT BLOOD: 42.3 % (ref 35.9–46.0)
HEMOGLOBIN: 13.9 g/dL (ref 11.7–15.5)
LYMPHOCYTES ABSOLUTE COUNT: 1050 {cells}/uL (ref 850–3900)
LYMPHOCYTES: 17.5 %
MCH: 26.8 pg — ABNORMAL LOW (ref 27.0–33.0)
MCHC-HEMOGLOBINOPATHY: 32.9 g/dL (ref 31.6–35.4)
MCV: 81.5 fL (ref 81.4–101.7)
MONOCYTES ABSOLUTE COUNT: 378 {cells}/uL (ref 200–950)
MONOCYTES: 6.3 %
MPV: 12.1 fL (ref 7.5–12.5)
NEUTROPHILS ABSOLUTE COUNT: 4476 {cells}/uL (ref 1500–7800)
NEUTROPHILS: 74.6 %
PLATELET COUNT: 228 Thousand/uL (ref 140–400)
RDW: 13.3 % (ref 11.0–15.0)
RED BLOOD CELL COUNT: 5.19 Million/uL — ABNORMAL HIGH (ref 3.80–5.10)
WHITE BLOOD CELL COUNT: 6 Thousand/uL (ref 3.8–10.8)

## 2024-02-08 NOTE — Progress Notes [1]
 Monitoring:Patient is up to date with recommended specialty medication monitoring. Patient prefers [facility] labs. Lymphocyte count has improved since the patient stopped fingolimod  (Gilenya ) in September. Fingolimod  (Gilenya )Monitoring Parameter Recommended Frequency Last Obtained Next Due CBC w/ differential  Baseline, 1, 3, and 6 months after initiation of therapy, then every 6 months 12/4/25ALC 1050 from 505 08/2024 LFTs Baseline, 1, 3, and 6 months after initiation of therapy, then every 6 months 02/07/24 08/2024 ECG  Baseline   N/A Ophthalmologic exams Baseline, and 3-4 months after initiation, then annually Patient has annual eye exams at [facility] (Dr. Emeline)   VZV titer Baseline, if no history of chickenpox or VZV vaccination Never done N/A    Latest Reference Range & Units 04/24/22 00:00 10/19/22 15:31 09/03/23 07:46 JCV Antibody   POSITIVE ! POSITIVE ! POSITIVE ! JCV Index index 2.82 (H) 2.70 (H) 2.52 (H) !: Data is abnormal(H): Data is abnormally Francois Sharps, PharmD, BCPS, MSCSAmbulatory Care Clinical Pharmacy Specialist, Neurology [facility]

## 2024-04-07 ENCOUNTER — Telehealth: Admit: 2024-04-07 | Payer: PRIVATE HEALTH INSURANCE | Primary: Internal Medicine

## 2024-04-07 ENCOUNTER — Encounter: Admit: 2024-04-07 | Payer: PRIVATE HEALTH INSURANCE | Attending: Pharmacist | Primary: Internal Medicine

## 2024-04-07 NOTE — Telephone Encounter [36]
 [facility] Patient Incoming CallDiane Clark called [facility], 04/07/2024 11:02 AM.  To cancel tomorrow's appointment . Patient not taking Gilenya  .[name], CPHT

## 2024-04-07 NOTE — Progress Notes [1]
 Pharmacy Note: Discharge from Pharmacy Hendrix Console has been discharged from ambulatory pharmacy services for the referral of neuro.Link to FlowsheetPatient Questions/Answers:Are you documenting a decline in services or discharge?: DischargeWhat services are impacted?: Specialty Integrated WorkflowWhat disease state was the patient referred for?: NeurologyWhy were services declined or patient discharged?: Therapy completion / discontinuationNatalie Theophilus, CPHT1:15 PM2/04/2024

## 2024-04-08 ENCOUNTER — Ambulatory Visit: Admit: 2024-04-08 | Payer: PRIVATE HEALTH INSURANCE | Attending: Pharmacist | Primary: Internal Medicine

## 2024-07-07 ENCOUNTER — Ambulatory Visit: Admit: 2024-07-07 | Payer: PRIVATE HEALTH INSURANCE | Primary: Internal Medicine

## 2024-07-07 ENCOUNTER — Encounter
Admit: 2024-07-07 | Payer: PRIVATE HEALTH INSURANCE | Attending: Student in an Organized Health Care Education/Training Program | Primary: Internal Medicine

## 2024-07-14 ENCOUNTER — Encounter
Admit: 2024-07-14 | Payer: PRIVATE HEALTH INSURANCE | Attending: Student in an Organized Health Care Education/Training Program | Primary: Internal Medicine
